# Patient Record
Sex: Female | Born: 1950 | Race: White | Hispanic: No | Marital: Married | Smoking: Never smoker
Health system: Southern US, Community
[De-identification: ages and names within clinical notes are randomized; demographics above are authoritative.]

## PROBLEM LIST (undated history)

## (undated) DIAGNOSIS — R748 Abnormal levels of other serum enzymes: Secondary | ICD-10-CM

## (undated) DIAGNOSIS — I10 Essential (primary) hypertension: Secondary | ICD-10-CM

## (undated) DIAGNOSIS — M1990 Unspecified osteoarthritis, unspecified site: Secondary | ICD-10-CM

## (undated) DIAGNOSIS — R7303 Prediabetes: Secondary | ICD-10-CM

## (undated) DIAGNOSIS — Z87442 Personal history of urinary calculi: Secondary | ICD-10-CM

## (undated) DIAGNOSIS — K219 Gastro-esophageal reflux disease without esophagitis: Secondary | ICD-10-CM

## (undated) DIAGNOSIS — E119 Type 2 diabetes mellitus without complications: Secondary | ICD-10-CM

## (undated) HISTORY — PX: EYE SURGERY: SHX253

## (undated) HISTORY — DX: Type 2 diabetes mellitus without complications: E11.9

## (undated) HISTORY — DX: Abnormal levels of other serum enzymes: R74.8

## (undated) HISTORY — PX: OTHER SURGICAL HISTORY: SHX169

## (undated) HISTORY — PX: TUBAL LIGATION: SHX77

---

## 2012-03-15 ENCOUNTER — Ambulatory Visit: Payer: 59

## 2012-04-19 ENCOUNTER — Emergency Department (HOSPITAL_COMMUNITY): Payer: 59

## 2012-04-19 ENCOUNTER — Encounter (HOSPITAL_COMMUNITY): Payer: Self-pay | Admitting: Emergency Medicine

## 2012-04-19 ENCOUNTER — Emergency Department (HOSPITAL_COMMUNITY)
Admission: EM | Admit: 2012-04-19 | Discharge: 2012-04-19 | Disposition: A | Discharge status: Home / Self Care | Payer: 59 | Attending: Emergency Medicine | Admitting: Emergency Medicine

## 2012-04-19 DIAGNOSIS — N200 Calculus of kidney: Secondary | ICD-10-CM

## 2012-04-19 LAB — URINALYSIS, ROUTINE W REFLEX MICROSCOPIC
Bilirubin Urine: NEGATIVE
Glucose, UA: NEGATIVE mg/dL
Specific Gravity, Urine: 1.014 (ref 1.005–1.030)
Urobilinogen, UA: 0.2 mg/dL (ref 0.0–1.0)
pH: 6 (ref 5.0–8.0)

## 2012-04-19 LAB — URINE MICROSCOPIC-ADD ON

## 2012-04-19 MED ORDER — HYDROCODONE-ACETAMINOPHEN 5-325 MG PO TABS: 2.0000 | ORAL_TABLET | ORAL | Status: DC | PRN

## 2012-04-19 MED ORDER — NAPROXEN 500 MG PO TABS: 500.0000 mg | ORAL_TABLET | Freq: Two times a day (BID) | ORAL | Status: DC

## 2012-04-19 MED ORDER — OXYCODONE-ACETAMINOPHEN 5-325 MG PO TABS
2.0000 | ORAL_TABLET | Freq: Four times a day (QID) | ORAL | Status: DC | PRN
  Administered 2012-04-19: 2 via ORAL
  Filled 2012-04-19: qty 2
  Filled 2012-04-19: qty 1

## 2012-04-19 NOTE — ED Provider Notes (Signed)
History     CSN: [id number]  Arrival date & time 04/19/12  1944   First MD Initiated Contact with Patient 04/19/12 2051      Chief Complaint  Patient presents with  . Flank Pain    (Consider location/radiation/quality/duration/timing/severity/associated sxs/prior treatment) HPI Comments: Flank pain is on the R, intermittent, acute in onset, colicky, radiates to the groin and the R leg, not associated with n/v/diaphoresis.  She has had no dysuria, but has been having some urinary frequency.  She has no f/c/n/v and has no hx of KS's.  She has had UTI's in the past.  Nothing makes better or worse, 10/10 at worst, 0/10 at this time.  Patient is a 62 y.o. female presenting with flank pain. The history is provided by the patient.  Flank Pain This is a new problem. Episode onset: had onset of flank pain a couple of weeks ago on the R and has bee nintermittent.    History reviewed. No pertinent past medical history.  History reviewed. No pertinent past surgical history.  Family History  Problem Relation Age of Onset  . Diabetes Mother   . CAD Mother     History  Substance Use Topics  . Smoking status: Never Smoker   . Smokeless tobacco: Not on file  . Alcohol Use: No    OB History   Grav Para Term Preterm Abortions TAB SAB Ect Mult Living                  Review of Systems  Genitourinary: Positive for flank pain.  All other systems reviewed and are negative.    Allergies  Penicillins; Shellfish allergy; and Sulfa antibiotics  Home Medications   Current Outpatient Rx  Name  Route  Sig  Dispense  Refill  . acetaminophen (TYLENOL) 500 MG tablet   Oral   Take 1,000 mg by mouth every 6 (six) hours as needed for pain.         . Multiple Vitamin (MULTIVITAMIN WITH MINERALS) TABS   Oral   Take 1 tablet by mouth daily.         Marland Kitchen HYDROcodone-acetaminophen (NORCO/VICODIN) 5-325 MG per tablet   Oral   Take 2 tablets by mouth every 4 (four) hours as needed for  pain.   10 tablet   0   . naproxen (NAPROSYN) 500 MG tablet   Oral   Take 1 tablet (500 mg total) by mouth 2 (two) times daily with a meal.   30 tablet   0     BP 156/78  Pulse 74  Temp(Src) 98 F (36.7 C) (Oral)  Resp 18  SpO2 98%  Physical Exam  Nursing note and vitals reviewed. Constitutional: She appears well-developed and well-nourished. No distress.  HENT:  Head: Normocephalic and atraumatic.  Mouth/Throat: Oropharynx is clear and moist. No oropharyngeal exudate.  Eyes: Conjunctivae and EOM are normal. Pupils are equal, round, and reactive to light. Right eye exhibits no discharge. Left eye exhibits no discharge. No scleral icterus.  Neck: Normal range of motion. Neck supple. No JVD present. No thyromegaly present.  Cardiovascular: Normal rate, regular rhythm, normal heart sounds and intact distal pulses.  Exam reveals no gallop and no friction rub.   No murmur heard. Pulmonary/Chest: Effort normal and breath sounds normal. No respiratory distress. She has no wheezes. She has no rales.  Abdominal: Soft. Bowel sounds are normal. She exhibits no distension and no mass. There is no tenderness.  No abd ttp, no CVA  ttp  Musculoskeletal: Normal range of motion. She exhibits no edema and no tenderness.  Lymphadenopathy:    She has no cervical adenopathy.  Neurological: She is alert. Coordination normal.  Skin: Skin is warm and dry. No rash noted. No erythema.  Psychiatric: She has a normal mood and affect. Her behavior is normal.    ED Course  Procedures (including critical care time)  Labs Reviewed  URINALYSIS, ROUTINE W REFLEX MICROSCOPIC - Abnormal; Notable for the following:    Hgb urine dipstick TRACE (*)    All other components within normal limits  URINE MICROSCOPIC-ADD ON   Ct Abdomen Pelvis Wo Contrast  04/19/2012  *RADIOLOGY REPORT*  Clinical Data: Right flank pain.  Dysuria.  CT ABDOMEN AND PELVIS WITHOUT CONTRAST  Technique:  Multidetector CT imaging of  the abdomen and pelvis was performed following the standard protocol without intravenous contrast.  Comparison: None.  Findings: Punctate calcifications in the spleen are compatible with remote granulomatous disease.  Liver at the upper limits of normal in size.  Adrenal glands and pancreatic contour within normal limits. Gallbladder contracted with a 9 mm gallstone visualized.  Small peripancreatic and portacaval lymph nodes noted.  1-2 mm mid kidney nonobstructive calculi noted bilaterally.  There is no right hydronephrosis, but there is a 2 mm calculus along the right UVJ junction which could be loose within the bladder or within the UVJ.  Appendix normal.  No dilated bowel.  Uterine and ovarian contours unremarkable.  No free pelvic fluid. No pathologic pelvic adenopathy is identified.  IMPRESSION:  1.  2 mm calculus in the vicinity of the right UVJ could be loose within the urinary bladder or in the UVJ.  No right-sided hydronephrosis or hydroureter. 2.  Single tiny punctate mid kidney nonobstructive calculi bilaterally. 3.  Gallstone observed. 4.  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 5.  Borderline prominent liver.   Original Report Authenticated By: Gaylyn Rong, M.D.      1. Kidney stone       MDM  At this time, pt is stable, has colicky pain without reproducible pain or evidence of hernias.  She has mild hypertension - at this time will CT abd without contrast to eval for KS.  CT reviewed, no signs of obstruction, 2 mm stone has already passed, pt still comfortable, meds and uro f/u given.      Vida Roller, MD 04/19/12 2214

## 2012-04-19 NOTE — ED Notes (Signed)
Pt states she is having pain in her right flank that goes around and is having lower abd cramping  Pt states she also notices an odor  Pt states she is having urinary frequency

## 2012-04-19 NOTE — ED Notes (Signed)
Patient c/o R sided flank pain, increased urine frequency and pain with urination. This has been going on intermittently for 2 weeks. Patient denies pain at this time.

## 2013-05-24 ENCOUNTER — Other Ambulatory Visit: Payer: Self-pay | Admitting: Family Medicine

## 2013-05-24 DIAGNOSIS — M40209 Unspecified kyphosis, site unspecified: Secondary | ICD-10-CM

## 2015-07-14 ENCOUNTER — Other Ambulatory Visit: Payer: Self-pay | Admitting: Family Medicine

## 2015-07-14 DIAGNOSIS — M810 Age-related osteoporosis without current pathological fracture: Secondary | ICD-10-CM

## 2015-07-28 ENCOUNTER — Ambulatory Visit
Admission: RE | Admit: 2015-07-28 | Discharge: 2015-07-28 | Disposition: A | Discharge status: Home / Self Care | Payer: 59 | Source: Ambulatory Visit | Attending: Family Medicine | Admitting: Family Medicine

## 2015-07-28 DIAGNOSIS — M810 Age-related osteoporosis without current pathological fracture: Secondary | ICD-10-CM

## 2017-05-19 ENCOUNTER — Encounter: Payer: Self-pay | Admitting: Gastroenterology

## 2017-05-21 NOTE — Progress Notes (Signed)
Patient ID: Jillian Powell, female   DOB: 1950/03/24, 67 y.o.   MRN: [id number]          Chief complaint: High calcium  History of Present Illness:  Referring PCP: Scherrie Gerlach PA   Review of records show that she has had a high calcium since 11/16 Except for once her calcium has been below 11, highest calcium 11.4 Most recent calcium level is 10.7 done in February 2019  No results found for: CALCIUM  The hypercalcemia is not associated with any pathologic fractures, renal insufficiency, sarcoidosis, known carcinoma, hyperthyroidism.  She did have kidney stones in 2015  She thinks she has lost 1.5 inches since she was young  Menopause occurred at age 33  Told to have osteoporosis and her T score was -2.5 but date of the study is not known  Prior serologic and radiologic studies have included:  No results found for: PTH, CALCIUM, CAION, PHOS   PTH levels: In 09/2015 = 46 and in 11/17 = 24  01/26/2016: 24 urine calcium =526.4 (H)  25 (OH) Vitamin D level, last was 29.5 and 1, 25 hydroxy vitamin D was 43.7    Parathyroid scan in done in 03/2016: Mild activity corresponding to 5 x 5 x 3 mm nodular density posterior to the upper pole of the right thyroid lobe. These findings suggest the possibility of small parathyroid adenoma at this site.    No results found for: VD25OH  Prior treatment has included boniva for 3 years, not clear of the starting and stopping dates, probably stopped in 2017 She does not take any calcium or vitamin D supplements   Allergies as of 05/22/2017      Reactions   Penicillins Hives, Itching   Shellfish Allergy    Sulfa Antibiotics Hives, Itching      Medication List        Accurate as of 05/22/17  9:37 PM. Always use your most recent med list.          LOTREL 5-10 MG capsule Generic drug:  amLODipine-benazepril TK 1 C PO D   multivitamin with minerals Tabs tablet Take 1 tablet by mouth daily.       Allergies:  Allergies  Allergen  Reactions  . Penicillins Hives and Itching  . Shellfish Allergy   . Sulfa Antibiotics Hives and Itching    History reviewed. No pertinent past medical history.  History reviewed. No pertinent surgical history.  Family History  Problem Relation Age of Onset  . Diabetes Mother   . CAD Mother   . Thyroid disease Neg Hx     Social History:  reports that  has never smoked. She does not have any smokeless tobacco history on file. She reports that she does not drink alcohol or use drugs.  Review of Systems  Constitutional: Positive for weight loss.       Previous range 164-180, has been trying to lose weight  Eyes: Negative for visual disturbance.  Respiratory: Negative for shortness of breath.   Cardiovascular: Negative for palpitations and leg swelling.  Gastrointestinal: Negative for vomiting and constipation.  Endocrine: Negative for fatigue.       She had a fasting glucose of 162 in February 19, no recent A1c available.  Thyroid level has been normal  Genitourinary: Positive for nocturia.       1-2 times  Musculoskeletal: Negative for joint pain.  Skin: Negative for rash.  Neurological: Negative for numbness and tingling.     EXAM:  BP  128/80 (BP Location: Left Arm, Patient Position: Sitting, Cuff Size: Normal)   Pulse 68   Ht 5\' 4"  (1.626 m)   Wt 164 lb (74.4 kg)   SpO2 98%   BMI 28.15 kg/m   GENERAL: Averagely built and nourished  No pallor, clubbing, lymphadenopathy or edema.  Skin:  no rash or pigmentation.  EYES:  Externally normal.  Fundii:  normal discs and vessels.  ENT: Oral mucosa and tongue normal.  THYROID:  Not palpable.   HEART:  Normal  S1 and S2; no murmur or click.  CHEST:  Normal shape Lungs:   Vescicular breath sounds heard equally.  No crepitations/ wheeze.  ABDOMEN:  No distention.  Liver and spleen not palpable.  No other mass or tenderness.  NEUROLOGICAL: .Reflexes are normal bilaterally at biceps.  SPINE AND JOINTS:   Normal.  Assessment/Plan:   HYPERPARATHYROIDISM:  Currently the patient has mild persistent hypercalcemia since at least 2016 which is stable This was confirmed with inappropriately high PTH at baseline and finding of a parathyroid adenoma on nuclear scanning previously  Discussed the nature of primary hyperparathyroidism as well as normal role of the parathyroid glands and calcium and bone physiology.  Discussed potential  effects of hyperparathyroidism long-term on bone health, kidney stones and kidney function Explained to patient that surgery is indicated only there are symptoms of high calcium, calcium level over 1 point above the normal range or significant osteoporosis  Baseline bone density showed T score of -2.5 prior to bisphosphonate treatment which has been stopped a few years ago The status of her osteoporosis is unknown and not clear when she had her last bone density  She also has had significant hypercalciuria but no kidney stones since 2015  RECOMMENDATIONS:  Follow-up bone density to assess effects of hyperparathyroidism on bone strength and this will determine further management  Since she is refusing to consider parathyroid surgery will discuss options to control her osteoporosis  Given her options of various treatments for osteoporosis and she may be a good candidate for Prolia since she already has tried bisphosphonates and needs likely a long-term continued treatment that would be safe  Check 24-hour urine calcium  Check vitamin D level and also 1, 25 hydroxy vitamin D level to determine need for supplementation  Currently patient is reluctant to take any medications since she thinks she can heal with Faith and will discuss her treatment plan again once labs are available  DIABETES ?  Her fasting blood sugar was 162 and she may have diabetes but need to have an A1c to confirm this today  History of fatty liver: Although she has lost weight she continues to  have high liver functions may consider Actoplusmet if she has diabetes also   Elayne Snare 05/22/2017, 9:37 PM

## 2017-05-22 ENCOUNTER — Encounter: Payer: Self-pay | Admitting: Endocrinology

## 2017-05-22 ENCOUNTER — Ambulatory Visit: Payer: 59 | Admitting: Endocrinology

## 2017-05-22 VITALS — BP 128/80 | HR 68 | Ht 64.0 in | Wt 164.0 lb

## 2017-05-22 DIAGNOSIS — M810 Age-related osteoporosis without current pathological fracture: Secondary | ICD-10-CM

## 2017-05-22 DIAGNOSIS — E213 Hyperparathyroidism, unspecified: Secondary | ICD-10-CM | POA: Diagnosis not present

## 2017-05-22 DIAGNOSIS — R7301 Impaired fasting glucose: Secondary | ICD-10-CM | POA: Diagnosis not present

## 2017-05-22 DIAGNOSIS — E559 Vitamin D deficiency, unspecified: Secondary | ICD-10-CM

## 2017-05-23 ENCOUNTER — Encounter: Payer: Self-pay | Admitting: Endocrinology

## 2017-05-23 LAB — GLUCOSE, RANDOM: Glucose, Bld: 106 mg/dL — ABNORMAL HIGH (ref 70–99)

## 2017-05-23 LAB — VITAMIN D 25 HYDROXY (VIT D DEFICIENCY, FRACTURES): VITD: 24.67 ng/mL — AB (ref 30.00–100.00)

## 2017-05-23 LAB — HEMOGLOBIN A1C: HEMOGLOBIN A1C: 7 % — AB (ref 4.6–6.5)

## 2017-05-24 ENCOUNTER — Telehealth: Payer: Self-pay | Admitting: Endocrinology

## 2017-05-24 NOTE — Telephone Encounter (Signed)
Patient stated she had some test done, she is calling for the results.

## 2017-05-25 NOTE — Telephone Encounter (Signed)
She has diabetes with an A1c of 7%.  Recommend starting metformin ER since she already is doing well with diet and exercise.  She will take 1 tablet daily for the first 5 days and then 2 tablets daily until her follow-up. Her vitamin D level is low and she can take vitamin D3, 1000 units daily She needs to return in her 24-hour urine test also

## 2017-05-25 NOTE — Telephone Encounter (Signed)
Patient calling for results but they have not been released yet please advise

## 2017-05-29 LAB — VITAMIN D 1,25 DIHYDROXY
VITAMIN D 1, 25 (OH) TOTAL: 72 pg/mL — AB
Vitamin D2 1, 25 (OH)2: <10 pg/mL
Vitamin D3 1, 25 (OH)2: 71 pg/mL

## 2017-05-31 ENCOUNTER — Other Ambulatory Visit: Payer: Medicare HMO

## 2017-06-01 ENCOUNTER — Other Ambulatory Visit: Payer: Self-pay | Admitting: Endocrinology

## 2017-06-01 DIAGNOSIS — E1165 Type 2 diabetes mellitus with hyperglycemia: Secondary | ICD-10-CM

## 2017-06-03 LAB — CREATININE, URINE, 24 HOUR
Creatinine, 24H Ur: 383 mg/24 hr — ABNORMAL LOW (ref 800–1800)
Creatinine, Urine: 69.7 mg/dL

## 2017-06-03 LAB — CALCIUM, URINE, 24 HOUR
Calcium, 24H Urine: 188.7 mg/24 hr (ref 100.0–300.0)
Calcium, Urine: 34.3 mg/dL

## 2017-06-06 NOTE — Telephone Encounter (Signed)
Patient has been informed of the note below but is refusing metformin at this time

## 2017-06-23 ENCOUNTER — Encounter: Payer: Medicare HMO | Attending: Endocrinology | Admitting: Dietician

## 2017-06-23 ENCOUNTER — Encounter: Payer: Self-pay | Admitting: Dietician

## 2017-06-23 DIAGNOSIS — Z6826 Body mass index (BMI) 26.0-26.9, adult: Secondary | ICD-10-CM | POA: Diagnosis not present

## 2017-06-23 DIAGNOSIS — Z713 Dietary counseling and surveillance: Secondary | ICD-10-CM | POA: Diagnosis not present

## 2017-06-23 DIAGNOSIS — E1165 Type 2 diabetes mellitus with hyperglycemia: Secondary | ICD-10-CM | POA: Insufficient documentation

## 2017-06-23 DIAGNOSIS — E119 Type 2 diabetes mellitus without complications: Secondary | ICD-10-CM

## 2017-06-23 NOTE — Progress Notes (Signed)
Diabetes Self-Management Education  Visit Type: First/Initial  Appt. Start Time: 1040 Appt. End Time: 1150  06/23/2017  [name], identified by name and date of birth, is a 67 y.o. female with a diagnosis of Diabetes: Type 2. Other history includes hyperparathyroidism.  Diabetes is newly diagnosed. Labs include low vitamin D2. Weight has decreased 7 lbs in the past month from 164 lbs to 157 lbs.  Patient lives with her husband who has diabetes and CKD.  She has been re-reading his educational material, measuring foods, staying active and being more mindful of food choices.  She is a retired Chief Strategy Officer.  ASSESSMENT  Height 5\' 4"  (1.626 m), weight 157 lb (71.2 kg). Body mass index is 26.95 kg/m.  Diabetes Self-Management Education - 06/23/17 1049      Visit Information   Visit Type  First/Initial      Initial Visit   Diabetes Type  Type 2    Are you currently following a meal plan?  No    Are you taking your medications as prescribed?  Not on Medications    Date Diagnosed  05/2016      Health Coping   How would you rate your overall health?  Excellent      Psychosocial Assessment   Patient Belief/Attitude about Diabetes  Motivated to manage diabetes    Self-care barriers  None    Self-management support  Doctor's office;Family    Other persons present  Patient    Patient Concerns  Nutrition/Meal planning;Glycemic Control;Weight Control    Special Needs  None    Preferred Learning Style  No preference indicated    Learning Readiness  Ready    How often do you need to have someone help you when you read instructions, pamphlets, or other written materials from your doctor or pharmacy?  1 - Never      Pre-Education Assessment   Patient understands the diabetes disease and treatment process.  Needs Instruction    Patient understands incorporating nutritional management into lifestyle.  Needs Instruction    Patient undertands incorporating physical activity into  lifestyle.  Needs Instruction    Patient understands using medications safely.  Needs Instruction    Patient understands monitoring blood glucose, interpreting and using results  Needs Instruction    Patient understands prevention, detection, and treatment of acute complications.  Needs Instruction    Patient understands prevention, detection, and treatment of chronic complications.  Needs Instruction    Patient understands how to develop strategies to address psychosocial issues.  Needs Instruction    Patient understands how to develop strategies to promote health/change behavior.  Needs Instruction      Complications   Last HgB A1C per patient/outside source  7 % 05/2016    How often do you check your blood sugar?  0 times/day (not testing)    Fasting Blood glucose range (mg/dL)  70-129    Have you had a dilated eye exam in the past 12 months?  Yes    Have you had a dental exam in the past 12 months?  Yes    Are you checking your feet?  No      Dietary Intake   Breakfast  coffee, 1% milk, [employer] toast with butter, occasional fruit OR egg and 1 strip bacon on the weekend, 1 slice toast [facility]) with butter    Snack (morning)  none    Lunch  salad, cranberries, nuts, tuna or 1 ounce deli meat    Snack (afternoon)  none    Dinner  salad, protein, potatoe or rice    Snack (evening)  none    Beverage(s)  cofee, 1% milk, water, rare splenda lemonade      Exercise   Exercise Type  Light (walking / raking leaves)    How many days per week to you exercise?  7    How many minutes per day do you exercise?  40    Total minutes per week of exercise  280      Patient Education   Previous Diabetes Education  No    Disease state   Definition of diabetes, type 1 and 2, and the diagnosis of diabetes    Nutrition management   Food label reading, portion sizes and measuring food.;Role of diet in the treatment of diabetes and the relationship between the three main macronutrients and blood glucose level;Meal  options for control of blood glucose level and chronic complications.    Medications  Taught/reviewed insulin injection, site rotation, insulin storage and needle disposal.    Chronic complications  Assessed and discussed foot care and prevention of foot problems;Relationship between chronic complications and blood glucose control    Psychosocial adjustment  Role of stress on diabetes;Worked with patient to identify barriers to care and solutions      Individualized Goals (developed by patient)   Nutrition  General guidelines for healthy choices and portions discussed    Physical Activity  Exercise 5-7 days per week;45 minutes per day    Monitoring   Not Applicable    Reducing Risk  increase portions of healthy fats    Health Coping  discuss diabetes with (comment) MD, RD, CDE      Post-Education Assessment   Patient understands the diabetes disease and treatment process.  Demonstrates understanding / competency    Patient understands incorporating nutritional management into lifestyle.  Demonstrates understanding / competency    Patient undertands incorporating physical activity into lifestyle.  Demonstrates understanding / competency    Patient understands using medications safely.  Demonstrates understanding / competency    Patient understands monitoring blood glucose, interpreting and using results  Demonstrates understanding / competency    Patient understands prevention, detection, and treatment of acute complications.  Demonstrates understanding / competency    Patient understands prevention, detection, and treatment of chronic complications.  Demonstrates understanding / competency    Patient understands how to develop strategies to address psychosocial issues.  Demonstrates understanding / competency    Patient understands how to develop strategies to promote health/change behavior.  Demonstrates understanding / competency      Outcomes   Expected Outcomes  Demonstrated interest in  learning. Expect positive outcomes    Future DMSE  PRN    Program Status  Completed       Individualized Plan for Diabetes Self-Management Training:   Learning Objective:  Patient will have a greater understanding of diabetes self-management. Patient education plan is to attend individual and/or group sessions per assessed needs and concerns.   Plan:   Patient Instructions  Continue to eat mindfully. Continue to stay active. Great job on changing your beverages to those without sugar!  Aim for 2-3 Carb Choices per meal (30-45 grams) +/- 1 either way  Aim for 0-1 Carbs per snack if hungry  Include protein in moderation with your meals and snacks Continue reading food labels for Total Carbohydrate and Fat Grams of foods   Other Resources: Diabetes Food Hub Diabetes Forcast Diabetes Self-Management* [employer] Dr. Alyssa Grove (plant  based eating)     Expected Outcomes:  Demonstrated interest in learning. Expect positive outcomes  Education material provided: Food label handouts, A1C conversion sheet, Meal plan card, My Plate and Snack sheet, ADA Diabetes Your Take Control Guide  If problems or questions, patient to contact team via:  Phone  Future DSME appointment: PRN

## 2017-06-23 NOTE — Patient Instructions (Signed)
Continue to eat mindfully. Continue to stay active. Great job on changing your beverages to those without sugar!  Aim for 2-3 Carb Choices per meal (30-45 grams) +/- 1 either way  Aim for 0-1 Carbs per snack if hungry  Include protein in moderation with your meals and snacks Continue reading food labels for Total Carbohydrate and Fat Grams of foods   Other Resources: Diabetes Food Hub Diabetes Forcast Diabetes Self-Management* [employer] Dr. Alyssa Grove (plant based eating)

## 2017-06-27 ENCOUNTER — Telehealth: Payer: Self-pay

## 2017-06-27 NOTE — Telephone Encounter (Signed)
Pt called to check on status of bone density appointment. Pt stated that she has an upcoming appointment on 07/28/2017. Pt was informed of MD instruction to call and schedule an appointment with Dr. Dwyane Dee once the bone density scan is completed. Pt verbalized understanding.

## 2017-07-03 ENCOUNTER — Encounter: Payer: Self-pay | Admitting: Gastroenterology

## 2017-07-03 ENCOUNTER — Other Ambulatory Visit (INDEPENDENT_AMBULATORY_CARE_PROVIDER_SITE_OTHER): Payer: Medicare HMO

## 2017-07-03 ENCOUNTER — Ambulatory Visit: Payer: Medicare HMO | Admitting: Gastroenterology

## 2017-07-03 VITALS — BP 108/72 | HR 70 | Ht 64.0 in | Wt 157.4 lb

## 2017-07-03 DIAGNOSIS — R945 Abnormal results of liver function studies: Secondary | ICD-10-CM

## 2017-07-03 DIAGNOSIS — R7989 Other specified abnormal findings of blood chemistry: Secondary | ICD-10-CM

## 2017-07-03 LAB — CBC WITH DIFFERENTIAL/PLATELET
Basophils Absolute: 0 10*3/uL (ref 0.0–0.1)
Basophils Relative: 1.1 % (ref 0.0–3.0)
Eosinophils Absolute: 0.1 10*3/uL (ref 0.0–0.7)
Eosinophils Relative: 2.8 % (ref 0.0–5.0)
HCT: 40.9 % (ref 36.0–46.0)
Hemoglobin: 14 g/dL (ref 12.0–15.0)
LYMPHS ABS: 1.4 10*3/uL (ref 0.7–4.0)
Lymphocytes Relative: 31.8 % (ref 12.0–46.0)
MCHC: 34.3 g/dL (ref 30.0–36.0)
MCV: 88.5 fl (ref 78.0–100.0)
MONO ABS: 0.4 10*3/uL (ref 0.1–1.0)
Monocytes Relative: 8.6 % (ref 3.0–12.0)
NEUTROS ABS: 2.5 10*3/uL (ref 1.4–7.7)
NEUTROS PCT: 55.7 % (ref 43.0–77.0)
PLATELETS: 244 10*3/uL (ref 150.0–400.0)
RBC: 4.62 Mil/uL (ref 3.87–5.11)
RDW: 13.3 % (ref 11.5–15.5)
WBC: 4.5 10*3/uL (ref 4.0–10.5)

## 2017-07-03 LAB — COMPREHENSIVE METABOLIC PANEL
ALK PHOS: 98 U/L (ref 39–117)
ALT: 52 U/L — AB (ref 0–35)
AST: 47 U/L — ABNORMAL HIGH (ref 0–37)
Albumin: 4.3 g/dL (ref 3.5–5.2)
BUN: 13 mg/dL (ref 6–23)
CO2: 25 mEq/L (ref 19–32)
Calcium: 10.9 mg/dL — ABNORMAL HIGH (ref 8.4–10.5)
Chloride: 106 mEq/L (ref 96–112)
Creatinine, Ser: 0.63 mg/dL (ref 0.40–1.20)
GFR: 100.16 mL/min (ref >60.00)
GLUCOSE: 91 mg/dL (ref 70–99)
POTASSIUM: 4.1 meq/L (ref 3.5–5.1)
SODIUM: 139 meq/L (ref 135–145)
TOTAL PROTEIN: 7.9 g/dL (ref 6.0–8.3)
Total Bilirubin: 0.5 mg/dL (ref 0.2–1.2)

## 2017-07-03 LAB — IBC PANEL
Iron: 115 ug/dL (ref 42–145)
Saturation Ratios: 36 % (ref 20.0–50.0)
TRANSFERRIN: 228 mg/dL (ref 212.0–360.0)

## 2017-07-03 LAB — FERRITIN: Ferritin: 736 ng/mL — ABNORMAL HIGH (ref 10.0–291.0)

## 2017-07-03 LAB — IGA: IGA: 324 mg/dL (ref 68–378)

## 2017-07-03 LAB — PROTIME-INR
INR: 1.1 ratio — AB (ref 0.8–1.0)
PROTHROMBIN TIME: 12.7 s (ref 9.6–13.1)

## 2017-07-03 NOTE — Patient Instructions (Addendum)
You will get labs drawn today:  Hepatitis A (IgM and IgG), Hepatitis B surface antigen, Hepatitis B surface antibody, Hepatitis C antibody, total iron, ferritin, TIBC, ANA, AMA, alphafeto protein (AFP), anti smooth muscle antibody, alpha 1 antitrypsin, cerulloplasm, TTG, total IgA level, CBC, CMET, INR. [place] for elevated liver tests. Further recommendations pending the above tests.  You have been scheduled for an abdominal ultrasound at [facility] (1st floor of hospital) on 07/05/17 at 10:30am. Please arrive 15 minutes prior to your appointment for registration. Make certain not to have anything to eat or drink 6 hours prior to your appointment. Should you need to reschedule your appointment, please contact radiology at [phone]. This test typically takes about 30 minutes to perform.   Normal BMI (Body Mass Index- based on height and weight) is between 23 and 30. Your BMI today is Body mass index is 27.01 kg/m. Marland Kitchen Please consider follow up  regarding your BMI with your Primary Care Provider.

## 2017-07-03 NOTE — Progress Notes (Signed)
HPI: This is a very pleasant 67 year old woman who was referred to me by Rich Fuchs, PA  to evaluate elevated liver tests.    Chief complaint is elevated liver tests  8 pounds loss in 1 month.  Very rarely etoh drinker.  No liver disease in her family.  Says she had hep C testing previously and it was negative.  Vaccinated for hepatitis B (did 2/3 of the imunizations).  She has never been told that she had hepatitis, she has never turned jaundiced, she was never a big alcohol drinker.  I reviewed with her that her liver tests have been chronically elevated and this is new information to her.  Old Data Reviewed:  Lab results available in "care everywhere" show that her liver tests have been elevated for at least 5 years.  Mild transaminase elevation February 2014.  Same mild transaminase elevation with elevated alkaline phosphatase, mild, in early 2015.  Liver tests have been checked 6 or 8 times since then, transaminases and alkaline phosphatase all intermittently elevated since then.   Review of systems: Pertinent positive and negative review of systems were noted in the above HPI section. All other review negative.   Past Medical History:  Diagnosis Date  . Diabetes mellitus without complication ([place])     History reviewed. No pertinent surgical history.  Current Outpatient Medications  Medication Sig Dispense Refill  . Biotin 1 MG CAPS Take by mouth.    Marland Kitchen LOTREL 5-10 MG capsule TK 1 C PO D  1  . Multiple Vitamin (MULTIVITAMIN WITH MINERALS) TABS Take 1 tablet by mouth daily.     No current facility-administered medications for this visit.     Allergies as of 07/03/2017 - Review Complete 07/03/2017  Allergen Reaction Noted  . Penicillins Hives and Itching 04/19/2012  . Shellfish allergy  04/19/2012  . Sulfa antibiotics Hives and Itching 04/19/2012    Family History  Problem Relation Age of Onset  . Diabetes Mother   . CAD Mother   . Thyroid disease Neg  Hx     Social History   Socioeconomic History  . Marital status: Married    Spouse name: Not on file  . Number of children: Not on file  . Years of education: Not on file  . Highest education level: Not on file  Occupational History  . Not on file  Social Needs  . Financial resource strain: Not on file  . Food insecurity:    Worry: Not on file    Inability: Not on file  . Transportation needs:    Medical: Not on file    Non-medical: Not on file  Tobacco Use  . Smoking status: Never Smoker  . Smokeless tobacco: Never Used  Substance and Sexual Activity  . Alcohol use: No  . Drug use: No  . Sexual activity: Not on file  Lifestyle  . Physical activity:    Days per week: Not on file    Minutes per session: Not on file  . Stress: Not on file  Relationships  . Social connections:    Talks on phone: Not on file    Gets together: Not on file    Attends religious service: Not on file    Active member of club or organization: Not on file    Attends meetings of clubs or organizations: Not on file    Relationship status: Not on file  . Intimate partner violence:    Fear of current or ex partner:  Not on file    Emotionally abused: Not on file    Physically abused: Not on file    Forced sexual activity: Not on file  Other Topics Concern  . Not on file  Social History Narrative  . Not on file     Physical Exam: BP 108/72   Pulse 70   Ht 5\' 4"  (1.626 m)   Wt 157 lb 6 oz (71.4 kg)   BMI 27.01 kg/m  Constitutional: generally well-appearing Psychiatric: alert and oriented x3 Eyes: extraocular movements intact Mouth: oral pharynx moist, no lesions Neck: supple no lymphadenopathy Cardiovascular: heart regular rate and rhythm Lungs: clear to auscultation bilaterally Abdomen: soft, nontender, nondistended, no obvious ascites, no peritoneal signs, normal bowel sounds Extremities: no lower extremity edema bilaterally Skin: no lesions on visible extremities   Assessment  and plan: 67 y.o. female with elevated liver tests, no clinical sign of cirrhosis  Possibly she has fatty liver disease.  She has no clinical signs of cirrhosis or some type symptoms based on her history of cirrhosis.  She will get an [place] and a battery of blood tests to work-up the etiology of her elevated liver test, see those test below.  Further recommendations pending those results.   Please see the "Patient Instructions" section for addition details about the plan.   Owens Loffler, MD [facility] 07/03/2017, 10:58 AM  Cc: Rich Fuchs, PA

## 2017-07-04 LAB — TISSUE TRANSGLUTAMINASE, IGA: (tTG) Ab, IgA: 1 U/mL

## 2017-07-04 LAB — HEPATITIS C ANTIBODY
HEP C AB: NONREACTIVE
SIGNAL TO CUT-OFF: 0.02 (ref <1.00)

## 2017-07-04 LAB — HEPATITIS B SURFACE ANTIGEN: HEP B S AG: NONREACTIVE

## 2017-07-04 LAB — HEPATITIS A ANTIBODY, TOTAL: HEPATITIS A AB,TOTAL: REACTIVE — AB

## 2017-07-04 LAB — HEPATITIS B SURFACE ANTIBODY, QUANTITATIVE: Hepatitis B-Post: 419 m[IU]/mL (ref >=10)

## 2017-07-04 LAB — ALPHA-1-ANTITRYPSIN: A-1 Antitrypsin, Ser: 135 mg/dL (ref 83–199)

## 2017-07-04 LAB — CERULOPLASMIN: Ceruloplasmin: 34 mg/dL (ref 18–53)

## 2017-07-04 LAB — AFP TUMOR MARKER: AFP-Tumor Marker: 2.2 ng/mL

## 2017-07-05 ENCOUNTER — Ambulatory Visit (HOSPITAL_COMMUNITY)
Admission: RE | Admit: 2017-07-05 | Discharge: 2017-07-05 | Disposition: A | Discharge status: Home / Self Care | Payer: Medicare HMO | Source: Ambulatory Visit | Attending: Gastroenterology | Admitting: Gastroenterology

## 2017-07-05 DIAGNOSIS — R945 Abnormal results of liver function studies: Principal | ICD-10-CM

## 2017-07-05 DIAGNOSIS — R7989 Other specified abnormal findings of blood chemistry: Secondary | ICD-10-CM

## 2017-07-05 LAB — ANA: Anti Nuclear Antibody(ANA): POSITIVE — AB

## 2017-07-05 LAB — ANTI-NUCLEAR AB-TITER (ANA TITER)

## 2017-07-05 LAB — MITOCHONDRIAL ANTIBODIES

## 2017-07-06 LAB — ANTI-SMOOTH MUSCLE ANTIBODY, IGG: Actin (Smooth Muscle) Antibody (IGG): <20 U (ref <20)

## 2017-07-10 ENCOUNTER — Telehealth: Payer: Self-pay | Admitting: Gastroenterology

## 2017-07-10 NOTE — Telephone Encounter (Signed)
Voicemail message left on patients phone with the phone number to contact to reschedule her [place]. I also mentioned she can call me back with dates available and I will call to reschedule.

## 2017-07-10 NOTE — Telephone Encounter (Signed)
Patient wants to reschedule [place] that was canceled on 5.1.19.

## 2017-07-12 ENCOUNTER — Other Ambulatory Visit: Payer: Self-pay | Admitting: Physician Assistant

## 2017-07-12 DIAGNOSIS — Z1231 Encounter for screening mammogram for malignant neoplasm of breast: Secondary | ICD-10-CM

## 2017-07-14 ENCOUNTER — Ambulatory Visit (HOSPITAL_COMMUNITY)
Admission: RE | Admit: 2017-07-14 | Discharge: 2017-07-14 | Disposition: A | Discharge status: Home / Self Care | Payer: Medicare HMO | Source: Ambulatory Visit | Attending: Gastroenterology | Admitting: Gastroenterology

## 2017-07-14 DIAGNOSIS — R7989 Other specified abnormal findings of blood chemistry: Secondary | ICD-10-CM | POA: Diagnosis present

## 2017-07-14 DIAGNOSIS — K8020 Calculus of gallbladder without cholecystitis without obstruction: Secondary | ICD-10-CM | POA: Diagnosis not present

## 2017-07-14 DIAGNOSIS — K760 Fatty (change of) liver, not elsewhere classified: Secondary | ICD-10-CM | POA: Insufficient documentation

## 2017-07-17 ENCOUNTER — Other Ambulatory Visit: Payer: Self-pay

## 2017-07-17 DIAGNOSIS — R945 Abnormal results of liver function studies: Principal | ICD-10-CM

## 2017-07-17 DIAGNOSIS — R7989 Other specified abnormal findings of blood chemistry: Secondary | ICD-10-CM

## 2017-07-26 ENCOUNTER — Other Ambulatory Visit: Payer: Medicare HMO

## 2017-07-26 DIAGNOSIS — R7989 Other specified abnormal findings of blood chemistry: Secondary | ICD-10-CM

## 2017-07-26 DIAGNOSIS — R945 Abnormal results of liver function studies: Principal | ICD-10-CM

## 2017-07-28 ENCOUNTER — Other Ambulatory Visit: Payer: Medicare HMO

## 2017-07-28 LAB — PROTEIN ELECTROPHORESIS, SERUM
Albumin ELP: 4.3 g/dL (ref 3.8–4.8)
Alpha 1: 0.3 g/dL (ref 0.2–0.3)
Alpha 2: 0.7 g/dL (ref 0.5–0.9)
BETA GLOBULIN: 0.5 g/dL (ref 0.4–0.6)
Beta 2: 0.5 g/dL (ref 0.2–0.5)
Gamma Globulin: 1.5 g/dL (ref 0.8–1.7)
Total Protein: 7.8 g/dL (ref 6.1–8.1)

## 2017-08-03 ENCOUNTER — Other Ambulatory Visit: Payer: Self-pay

## 2017-08-03 DIAGNOSIS — R7989 Other specified abnormal findings of blood chemistry: Secondary | ICD-10-CM

## 2017-08-03 DIAGNOSIS — R945 Abnormal results of liver function studies: Principal | ICD-10-CM

## 2017-08-16 ENCOUNTER — Ambulatory Visit (INDEPENDENT_AMBULATORY_CARE_PROVIDER_SITE_OTHER)
Admission: RE | Admit: 2017-08-16 | Discharge: 2017-08-16 | Disposition: A | Discharge status: Home / Self Care | Payer: Medicare HMO | Source: Ambulatory Visit | Attending: Endocrinology | Admitting: Endocrinology

## 2017-08-16 ENCOUNTER — Ambulatory Visit
Admission: RE | Admit: 2017-08-16 | Discharge: 2017-08-16 | Disposition: A | Discharge status: Home / Self Care | Payer: Medicare HMO | Source: Ambulatory Visit | Attending: Physician Assistant | Admitting: Physician Assistant

## 2017-08-16 ENCOUNTER — Other Ambulatory Visit: Payer: Medicare HMO

## 2017-08-16 DIAGNOSIS — Z1231 Encounter for screening mammogram for malignant neoplasm of breast: Secondary | ICD-10-CM

## 2017-08-16 DIAGNOSIS — M810 Age-related osteoporosis without current pathological fracture: Secondary | ICD-10-CM | POA: Diagnosis not present

## 2017-08-17 ENCOUNTER — Other Ambulatory Visit: Payer: Self-pay | Admitting: Physician Assistant

## 2017-08-17 DIAGNOSIS — R928 Other abnormal and inconclusive findings on diagnostic imaging of breast: Secondary | ICD-10-CM

## 2017-09-22 ENCOUNTER — Ambulatory Visit
Admission: RE | Admit: 2017-09-22 | Discharge: 2017-09-22 | Disposition: A | Discharge status: Home / Self Care | Payer: Medicare HMO | Source: Ambulatory Visit | Attending: Physician Assistant | Admitting: Physician Assistant

## 2017-09-22 ENCOUNTER — Ambulatory Visit: Payer: Medicare HMO

## 2017-09-22 DIAGNOSIS — R928 Other abnormal and inconclusive findings on diagnostic imaging of breast: Secondary | ICD-10-CM

## 2017-09-26 ENCOUNTER — Ambulatory Visit: Payer: Medicare HMO | Admitting: Endocrinology

## 2017-09-26 ENCOUNTER — Encounter: Payer: Self-pay | Admitting: Endocrinology

## 2017-09-26 VITALS — BP 116/66 | HR 86 | Ht 64.0 in | Wt 157.6 lb

## 2017-09-26 DIAGNOSIS — E213 Hyperparathyroidism, unspecified: Secondary | ICD-10-CM | POA: Insufficient documentation

## 2017-09-26 DIAGNOSIS — E1165 Type 2 diabetes mellitus with hyperglycemia: Secondary | ICD-10-CM

## 2017-09-26 LAB — POCT GLYCOSYLATED HEMOGLOBIN (HGB A1C): HEMOGLOBIN A1C: 5.8 % — AB (ref 4.0–5.6)

## 2017-09-26 LAB — GLUCOSE, POCT (MANUAL RESULT ENTRY): POC Glucose: 167 mg/dl — AB (ref 70–99)

## 2017-09-26 MED ORDER — RISEDRONATE SODIUM 150 MG PO TABS: 150.0000 mg | ORAL_TABLET | ORAL | 1 refills | Status: DC

## 2017-09-26 NOTE — Progress Notes (Signed)
Patient ID: Jillian Powell, female   DOB: [birth year], 67 y.o.   MRN: [id number]          Chief complaint: Endocrinology follow-up  History of Present Illness:   HYPERCALCEMIA:  Review of records show that she has had a high calcium since 11/16 Previously highest calcium 11.4 Most recent calcium level is 10.9, done in April 2019  Lab Results  Component Value Date   CALCIUM 10.9 (H) 07/03/2017   .  She did have kidney stones in 2015 Previously had high 24 urine calcium She had 24 urine calcium checked which was normal but her collection was inadequate with low amount of creatinine in the measurement  Prior serologic and radiologic studies have included:   PTH levels: In 09/2015 = 46 and in 11/17 = 24  01/26/2016: 24 urine calcium =526.4 (H)  25 (OH) Vitamin D level, last was 29.5 and 1, 25 hydroxy vitamin D was 43.7   BONE density done on 08/16/2017 showed:   Lumbar spine L1-L4 Femoral neck (FN) 33% distal radius  T-score -2.6 RFN: -2.3 LFN: -2.4 n/a  Change in BMD from previous DXA test (%) n/a n/a n/a   Previous bone density in 2017 showed spine T score -2.5   Parathyroid scan in done in 03/2016: Mild activity corresponding to 5 x 5 x 3 mm nodular density posterior to the upper pole of the right thyroid lobe. These findings suggest the possibility of small parathyroid adenoma at this site.    Lab Results  Component Value Date   VD25OH 24.67 (L) 05/22/2017    Prior treatment has included boniva for 3 years, not clear of the starting and stopping dates, probably stopped in 2017 She does not take any calcium or vitamin D supplements, does take multivitamin   Allergies as of 09/26/2017      Reactions   Penicillins Hives, Itching   Shellfish Allergy    Sulfa Antibiotics Hives, Itching      Medication List        Accurate as of 09/26/17 11:59 PM. Always use your most recent med list.          Biotin 1 MG Caps Take by mouth.   LOTREL 5-10 MG capsule Generic  drug:  amLODipine-benazepril TK 1 C PO D   multivitamin with minerals Tabs tablet Take 1 tablet by mouth daily.   risedronate 150 MG tablet Commonly known as:  ACTONEL Take 1 tablet (150 mg total) by mouth every 30 (thirty) days. with water on empty stomach, nothing by mouth or lie down for next 30 minutes.       Allergies:  Allergies  Allergen Reactions  . Penicillins Hives and Itching  . Shellfish Allergy   . Sulfa Antibiotics Hives and Itching    Past Medical History:  Diagnosis Date  . Diabetes mellitus without complication ([place])     History reviewed. No pertinent surgical history.  Family History  Problem Relation Age of Onset  . Diabetes Mother   . CAD Mother   . Thyroid disease Neg Hx   . Breast cancer Neg Hx     Social History:  reports that she has never smoked. She has never used smokeless tobacco. She reports that she does not drink alcohol or use drugs.  Review of Systems  DIABETES:  She was diagnosed to have diabetes with an A1c of 7% in March She has been reluctant to take metformin or start checking her sugar However she has seen the dietitian and  has made changes in diet Is trying to walk 30 minutes on most days and has lost weight  Lab glucose was 167 and hour after lunch   Wt Readings from Last 3 Encounters:  09/26/17 157 lb 9.6 oz (71.5 kg)  07/03/17 157 lb 6 oz (71.4 kg)  06/23/17 157 lb (71.2 kg)    Lab Results  Component Value Date   HGBA1C 5.8 (A) 09/26/2017   HGBA1C 7.0 (H) 05/22/2017   Lab Results  Component Value Date   CREATININE 0.63 07/03/2017     EXAM:  BP 116/66 (BP Location: Left Arm, Patient Position: Sitting, Cuff Size: Normal)   Pulse 86   Ht 5\' 4"  (1.626 m)   Wt 157 lb 9.6 oz (71.5 kg)   SpO2 97%   BMI 27.05 kg/m     Assessment/Plan:   HYPERPARATHYROIDISM:   She has mild persistent hypercalcemia since at least 2016 which is stable This was confirmed with inappropriately high PTH at baseline and  finding of a parathyroid adenoma on nuclear scanning previously She also has had significant hypercalciuria but no kidney stones since 2015 Last calcium was 10.9 and will continue observation  OSTEOPOROSIS: T score is -2.6 and she needs to be on pharmacological treatment She explained the need for fracture reduction in sleep with her hyperparathyroidism She probably had some stabilization of her bone density with Boniva previously will start a bisphosphonate again For now we will switch her to Actonel which is likely to be more effective discussed the differences  DIABETES   Blood sugars are better with A1c back to normal with weight loss and dietary changes She is also exercising and will motivated Continue with lifestyle changes alone   Elayne Snare 09/27/2017, 8:17 AM

## 2017-10-10 ENCOUNTER — Ambulatory Visit: Payer: Medicare HMO | Admitting: Gastroenterology

## 2017-10-10 ENCOUNTER — Encounter: Payer: Self-pay | Admitting: Gastroenterology

## 2017-10-10 ENCOUNTER — Other Ambulatory Visit (INDEPENDENT_AMBULATORY_CARE_PROVIDER_SITE_OTHER): Payer: Medicare HMO

## 2017-10-10 VITALS — BP 114/72 | HR 64 | Ht 64.0 in | Wt 154.6 lb

## 2017-10-10 DIAGNOSIS — R945 Abnormal results of liver function studies: Secondary | ICD-10-CM | POA: Diagnosis not present

## 2017-10-10 DIAGNOSIS — R7989 Other specified abnormal findings of blood chemistry: Secondary | ICD-10-CM

## 2017-10-10 DIAGNOSIS — E213 Hyperparathyroidism, unspecified: Secondary | ICD-10-CM | POA: Diagnosis not present

## 2017-10-10 LAB — BASIC METABOLIC PANEL
BUN: 14 mg/dL (ref 6–23)
CO2: 26 mEq/L (ref 19–32)
Calcium: 11.1 mg/dL — ABNORMAL HIGH (ref 8.4–10.5)
Chloride: 105 mEq/L (ref 96–112)
Creatinine, Ser: 0.69 mg/dL (ref 0.40–1.20)
GFR: 90.11 mL/min (ref >60.00)
Glucose, Bld: 93 mg/dL (ref 70–99)
POTASSIUM: 3.9 meq/L (ref 3.5–5.1)
SODIUM: 138 meq/L (ref 135–145)

## 2017-10-10 LAB — HEPATIC FUNCTION PANEL
ALBUMIN: 4.4 g/dL (ref 3.5–5.2)
ALK PHOS: 99 U/L (ref 39–117)
ALT: 33 U/L (ref 0–35)
AST: 28 U/L (ref 0–37)
Bilirubin, Direct: 0.2 mg/dL (ref 0.0–0.3)
Total Bilirubin: 0.7 mg/dL (ref 0.2–1.2)
Total Protein: 8 g/dL (ref 6.0–8.3)

## 2017-10-10 LAB — IRON: Iron: 102 ug/dL (ref 42–145)

## 2017-10-10 LAB — FERRITIN: FERRITIN: 404.2 ng/mL — AB (ref 10.0–291.0)

## 2017-10-10 NOTE — Progress Notes (Signed)
Review of pertinent gastrointestinal problems: 1. Elevated liver tests: Labs 2019: Hepatitis B surface antigen negative, Hep B Surface Ab +, Hep A Ab total +, celiac sprue testing negative, ceruloplasmin level normal, alpha-1 antitrypsin level normal, anti-smooth muscle antibody negative, AMA negative, ANA positive homogeneous pattern at 1:160 titer, ferritin 736, iron 115, sat ratio 36, SPEP normal, Hep C Ab negative. [place] 07/2017: gallstones and fatty liver, otherwise normal.    HPI: This is a very pleasant 67 year old woman whom I last saw here in the office 3 or 4 months ago  Chief complaint is elevated liver tests  She was supposed to get some blood work a few days prior to this appointment but I am not sure she ever got that message.  She says she is having some trouble with her phone system.  She feels fine, no abdominal pains, no jaundice.  ROS: complete GI ROS as described in HPI, all other review negative.  Constitutional:  No unintentional weight loss   Past Medical History:  Diagnosis Date  . Diabetes mellitus without complication ([place])     History reviewed. No pertinent surgical history.  Current Outpatient Medications  Medication Sig Dispense Refill  . Biotin 1 MG CAPS Take by mouth.    Marland Kitchen LOTREL 5-10 MG capsule TK 1 C PO D  1  . Multiple Vitamin (MULTIVITAMIN WITH MINERALS) TABS Take 1 tablet by mouth daily.    . risedronate (ACTONEL) 150 MG tablet Take 1 tablet (150 mg total) by mouth every 30 (thirty) days. with water on empty stomach, nothing by mouth or lie down for next 30 minutes. 3 tablet 1   No current facility-administered medications for this visit.     Allergies as of 10/10/2017 - Review Complete 10/10/2017  Allergen Reaction Noted  . Penicillins Hives and Itching 04/19/2012  . Shellfish allergy  04/19/2012  . Sulfa antibiotics Hives and Itching 04/19/2012    Family History  Problem Relation Age of Onset  . Diabetes Mother   . CAD Mother   . Thyroid  disease Neg Hx   . Breast cancer Neg Hx     Social History   Socioeconomic History  . Marital status: Married    Spouse name: Not on file  . Number of children: Not on file  . Years of education: Not on file  . Highest education level: Not on file  Occupational History  . Not on file  Social Needs  . Financial resource strain: Not on file  . Food insecurity:    Worry: Not on file    Inability: Not on file  . Transportation needs:    Medical: Not on file    Non-medical: Not on file  Tobacco Use  . Smoking status: Never Smoker  . Smokeless tobacco: Never Used  Substance and Sexual Activity  . Alcohol use: No  . Drug use: No  . Sexual activity: Not on file  Lifestyle  . Physical activity:    Days per week: Not on file    Minutes per session: Not on file  . Stress: Not on file  Relationships  . Social connections:    Talks on phone: Not on file    Gets together: Not on file    Attends religious service: Not on file    Active member of club or organization: Not on file    Attends meetings of clubs or organizations: Not on file    Relationship status: Not on file  . Intimate partner violence:  Fear of current or ex partner: Not on file    Emotionally abused: Not on file    Physically abused: Not on file    Forced sexual activity: Not on file  Other Topics Concern  . Not on file  Social History Narrative  . Not on file     Physical Exam: Wt 154 lb 9.6 oz (70.1 kg)   BMI 26.54 kg/m  Constitutional: generally well-appearing Psychiatric: alert and oriented x3 Abdomen: soft, nontender, nondistended, no obvious ascites, no peritoneal signs, normal bowel sounds No peripheral edema noted in lower extremities  Assessment and plan: 67 y.o. female with elevated liver tests  Ferritin was elevated, other iron studies normal.  ANA was elevated but her SPEP was normal.  She is going to have some repeat blood work today including a ANA, iron tests, LFTs.  Pending those  results she may need further testing or work-up.  Please see the "Patient Instructions" section for addition details about the plan.  Owens Loffler, MD [facility] 10/10/2017, 3:50 PM

## 2017-10-10 NOTE — Patient Instructions (Addendum)
You will have labs checked today in the basement lab.  Please head down after you check out with the front desk  (ANA, iron, TIBC, ferritin, LFTs)  Normal BMI (Body Mass Index- based on height and weight) is between 23 and 30. Your BMI today is Body mass index is 26.54 kg/m. Marland Kitchen Please consider follow up  regarding your BMI with your Primary Care Provider.

## 2017-10-11 LAB — IRON,TIBC AND FERRITIN PANEL
%SAT: 34 % (calc) (ref 16–45)
FERRITIN: 540 ng/mL — AB (ref 16–288)
IRON: 104 ug/dL (ref 45–160)
TIBC: 306 mcg/dL (calc) (ref 250–450)

## 2017-10-13 LAB — ANTI-NUCLEAR AB-TITER (ANA TITER): ANA Titer 1: 1:80 {titer} — ABNORMAL HIGH

## 2017-10-13 LAB — ANA: Anti Nuclear Antibody(ANA): POSITIVE — AB

## 2017-10-24 DIAGNOSIS — E119 Type 2 diabetes mellitus without complications: Secondary | ICD-10-CM | POA: Insufficient documentation

## 2017-10-25 ENCOUNTER — Telehealth: Payer: Self-pay | Admitting: Gastroenterology

## 2017-10-25 NOTE — Telephone Encounter (Signed)
Dr Ardis Hughs have you reviewed labs for this pt?

## 2017-10-26 ENCOUNTER — Other Ambulatory Visit: Payer: Self-pay

## 2017-10-26 DIAGNOSIS — R945 Abnormal results of liver function studies: Principal | ICD-10-CM

## 2017-10-26 DIAGNOSIS — R7989 Other specified abnormal findings of blood chemistry: Secondary | ICD-10-CM

## 2017-10-26 NOTE — Telephone Encounter (Signed)
See results note. 

## 2018-03-14 ENCOUNTER — Ambulatory Visit: Admission: RE | Admit: 2018-03-14 | Payer: Medicare HMO | Source: Ambulatory Visit

## 2018-03-14 ENCOUNTER — Other Ambulatory Visit: Payer: Self-pay | Admitting: Physician Assistant

## 2018-03-14 ENCOUNTER — Ambulatory Visit
Admission: RE | Admit: 2018-03-14 | Discharge: 2018-03-14 | Disposition: A | Discharge status: Home / Self Care | Payer: Medicare HMO | Source: Ambulatory Visit | Attending: Physician Assistant | Admitting: Physician Assistant

## 2018-03-14 DIAGNOSIS — R921 Mammographic calcification found on diagnostic imaging of breast: Secondary | ICD-10-CM

## 2018-04-11 ENCOUNTER — Other Ambulatory Visit: Payer: Self-pay | Admitting: Endocrinology

## 2018-04-11 DIAGNOSIS — E559 Vitamin D deficiency, unspecified: Secondary | ICD-10-CM

## 2018-04-11 DIAGNOSIS — E213 Hyperparathyroidism, unspecified: Secondary | ICD-10-CM

## 2018-04-11 DIAGNOSIS — R7301 Impaired fasting glucose: Secondary | ICD-10-CM

## 2018-04-16 ENCOUNTER — Other Ambulatory Visit: Payer: Medicare HMO

## 2018-04-19 ENCOUNTER — Ambulatory Visit: Payer: Medicare HMO | Admitting: Endocrinology

## 2018-04-24 ENCOUNTER — Other Ambulatory Visit: Payer: Medicare HMO

## 2018-04-24 ENCOUNTER — Other Ambulatory Visit (INDEPENDENT_AMBULATORY_CARE_PROVIDER_SITE_OTHER): Payer: Medicare HMO

## 2018-04-24 DIAGNOSIS — R7301 Impaired fasting glucose: Secondary | ICD-10-CM | POA: Diagnosis not present

## 2018-04-24 DIAGNOSIS — E559 Vitamin D deficiency, unspecified: Secondary | ICD-10-CM | POA: Diagnosis not present

## 2018-04-24 DIAGNOSIS — E213 Hyperparathyroidism, unspecified: Secondary | ICD-10-CM

## 2018-04-24 LAB — COMPREHENSIVE METABOLIC PANEL
ALBUMIN: 4.2 g/dL (ref 3.5–5.2)
ALK PHOS: 116 U/L (ref 39–117)
ALT: 58 U/L — ABNORMAL HIGH (ref 0–35)
AST: 45 U/L — AB (ref 0–37)
BILIRUBIN TOTAL: 0.4 mg/dL (ref 0.2–1.2)
BUN: 15 mg/dL (ref 6–23)
CALCIUM: 11.1 mg/dL — AB (ref 8.4–10.5)
CHLORIDE: 104 meq/L (ref 96–112)
CO2: 25 meq/L (ref 19–32)
Creatinine, Ser: 0.69 mg/dL (ref 0.40–1.20)
GFR: 84.64 mL/min (ref >60.00)
Glucose, Bld: 99 mg/dL (ref 70–99)
Potassium: 4.1 mEq/L (ref 3.5–5.1)
Sodium: 138 mEq/L (ref 135–145)
Total Protein: 7.7 g/dL (ref 6.0–8.3)

## 2018-04-24 LAB — VITAMIN D 25 HYDROXY (VIT D DEFICIENCY, FRACTURES): VITD: 19.39 ng/mL — ABNORMAL LOW (ref 30.00–100.00)

## 2018-04-24 LAB — HEMOGLOBIN A1C: HEMOGLOBIN A1C: 6.4 % (ref 4.6–6.5)

## 2018-04-27 ENCOUNTER — Ambulatory Visit: Payer: Medicare HMO | Admitting: Endocrinology

## 2018-04-27 ENCOUNTER — Encounter: Payer: Self-pay | Admitting: Endocrinology

## 2018-04-27 VITALS — BP 122/76 | HR 87 | Ht 64.0 in | Wt 161.4 lb

## 2018-04-27 DIAGNOSIS — E213 Hyperparathyroidism, unspecified: Secondary | ICD-10-CM | POA: Diagnosis not present

## 2018-04-27 DIAGNOSIS — E119 Type 2 diabetes mellitus without complications: Secondary | ICD-10-CM

## 2018-04-27 DIAGNOSIS — E559 Vitamin D deficiency, unspecified: Secondary | ICD-10-CM

## 2018-04-27 DIAGNOSIS — M810 Age-related osteoporosis without current pathological fracture: Secondary | ICD-10-CM | POA: Diagnosis not present

## 2018-04-27 NOTE — Patient Instructions (Signed)
VITAMIN D3 5000 UNITS DAILY with food  Walk daily  RECLAST yearly shot: check

## 2018-04-27 NOTE — Progress Notes (Signed)
Patient ID: Jillian Powell, female   DOB: 1950/08/09, 68 y.o.   MRN: [id number]          Chief complaint: Endocrinology follow-up  History of Present Illness:   HYPERCALCEMIA:  Review of her labs show that she has had a high calcium since 11/16 Previously highest calcium 11.4 Most recent calcium level is stable at 11.1  Lab Results  Component Value Date   CALCIUM 11.1 (H) 04/24/2018   CALCIUM 11.1 (H) 10/10/2017   CALCIUM 10.9 (H) 07/03/2017   .  She did have kidney stones in 2015 but no recurrence Previously had high 24 urine calcium Another 24 urine calcium checked which was normal but her collection was inadequate with low amount of creatinine in the measurement  Prior serologic and radiologic studies have included:   PTH levels: In 09/2015 = 46 and in 11/17 = 24  01/26/2016: 24 urine calcium =526.4 (H)  25 (OH) Vitamin D level, last was 29.5 and 1, 25 hydroxy vitamin D was 43.7   Parathyroid scan in done in 03/2016: Mild activity corresponding to 5 x 5 x 3 mm nodular density posterior to the upper pole of the right thyroid lobe. These findings suggest the possibility of small parathyroid adenoma at this site.    OSTEOPOROSIS:  BONE density done on 08/16/2017 showed:   Lumbar spine L1-L4 Femoral neck (FN) 33% distal radius  T-score -2.6 RFN: -2.3 LFN: -2.4 n/a  Change in BMD from previous DXA test (%) n/a n/a n/a   Previous bone density in 2017 showed spine T score -2.5  Prior treatment has included boniva for 3 years, not clear of the starting and stopping dates, probably stopped in 2017 In 2019 she was given risedronate 150 mg monthly but she stopped this because of high cost and did not call for replacement  Also not taking any vitamin D recently   Lab Results  Component Value Date   VD25OH 19.39 (L) 04/24/2018   VD25OH 24.67 (L) 05/22/2017       Allergies as of 04/27/2018      Reactions   Penicillins Hives, Itching   Shellfish Allergy    Sulfa  Antibiotics Hives, Itching      Medication List       Accurate as of April 27, 2018  2:41 PM. Always use your most recent med list.        Biotin 1 MG Caps Take by mouth.   LOTREL 5-10 MG capsule Generic drug:  amLODipine-benazepril TK 1 C PO D   multivitamin with minerals Tabs tablet Take 1 tablet by mouth daily.   risedronate 150 MG tablet Commonly known as:  ACTONEL Take 1 tablet (150 mg total) by mouth every 30 (thirty) days. with water on empty stomach, nothing by mouth or lie down for next 30 minutes.       Allergies:  Allergies  Allergen Reactions  . Penicillins Hives and Itching  . Shellfish Allergy   . Sulfa Antibiotics Hives and Itching    Past Medical History:  Diagnosis Date  . Diabetes mellitus without complication ([place])     History reviewed. No pertinent surgical history.  Family History  Problem Relation Age of Onset  . Diabetes Mother   . CAD Mother   . Thyroid disease Neg Hx   . Breast cancer Neg Hx     Social History:  reports that she has never smoked. She has never used smokeless tobacco. She reports that she does not drink alcohol or  use drugs.  Review of Systems  DIABETES:  She was diagnosed to have diabetes with an A1c of 7% in March 2019 She has been reluctant to take metformin However she has seen the dietitian in 2019 Although subsequently she did much better with A1c as low as 5.8 she has gained back some weight and A1c is higher again  She has not done well with the diet with poor choices and less portion control Also not exercising much now  Lab glucose was 99   Wt Readings from Last 3 Encounters:  04/27/18 161 lb 6.4 oz (73.2 kg)  10/10/17 154 lb 9.6 oz (70.1 kg)  09/26/17 157 lb 9.6 oz (71.5 kg)    Lab Results  Component Value Date   HGBA1C 6.4 04/24/2018   HGBA1C 5.8 (A) 09/26/2017   HGBA1C 7.0 (H) 05/22/2017   Lab Results  Component Value Date   CREATININE 0.69 04/24/2018     EXAM:  BP 122/76  (BP Location: Left Arm, Patient Position: Sitting, Cuff Size: Normal)   Pulse 87   Ht 5\' 4"  (1.626 m)   Wt 161 lb 6.4 oz (73.2 kg)   SpO2 96%   BMI 27.70 kg/m     Assessment/Plan:   HYPERPARATHYROIDISM:  She has mild persistent hypercalcemia since at least 2016 which is stable This was confirmed with inappropriately high PTH at baseline and finding of a parathyroid adenoma on nuclear scanning previously She also has had significant hypercalciuria initially but no kidney stones since 2015  Since she is asymptomatic and calcium is stable at 11.1 she is a candidate for observation only  OSTEOPOROSIS: T score is -2.6 and she needs to be on pharmacological treatment She stopped taking risedronate that was given last year and did not report this Given her the options of taking alendronate that would be less expensive for IV Reclast Explained to her in detail how IV Reclast works, benefits, possible side effects and answered all her questions about this  Discussed differences between oral and IV bisphosphonates and she will think about it and let us know her choice We will also need periodic bone density assessment  VITAMIN D deficiency: Discussed importance of supplementation and since her level is significantly low she will start with 5000 units OTC daily   DIABETES   Blood sugars getting relatively higher although A1c is still under 6.5 She thinks she can do better with consistent diet as before and start regular exercise with at least walking Discussed A1c and blood sugar goals  May consider metformin if she is not improving  Total visit time for evaluation and management of multiple problems and counseling =25 minutes   Patient Instructions  VITAMIN D3 5000 UNITS DAILY with food  Walk daily  RECLAST yearly shot: check     Elayne Snare 04/27/2018, 2:41 PM

## 2018-09-24 ENCOUNTER — Ambulatory Visit
Admission: RE | Admit: 2018-09-24 | Discharge: 2018-09-24 | Disposition: A | Discharge status: Home / Self Care | Payer: Medicare HMO | Source: Ambulatory Visit | Attending: Physician Assistant | Admitting: Physician Assistant

## 2018-09-24 ENCOUNTER — Other Ambulatory Visit: Payer: Self-pay

## 2018-09-24 DIAGNOSIS — R921 Mammographic calcification found on diagnostic imaging of breast: Secondary | ICD-10-CM

## 2019-03-29 ENCOUNTER — Encounter: Payer: Self-pay | Admitting: Gastroenterology

## 2019-03-29 ENCOUNTER — Other Ambulatory Visit (INDEPENDENT_AMBULATORY_CARE_PROVIDER_SITE_OTHER): Payer: Medicare HMO

## 2019-03-29 ENCOUNTER — Ambulatory Visit: Payer: Medicare HMO | Admitting: Gastroenterology

## 2019-03-29 VITALS — BP 120/86 | HR 88 | Temp 98.7°F | Ht 64.0 in | Wt 158.0 lb

## 2019-03-29 DIAGNOSIS — R7989 Other specified abnormal findings of blood chemistry: Secondary | ICD-10-CM | POA: Diagnosis not present

## 2019-03-29 LAB — COMPREHENSIVE METABOLIC PANEL
ALT: 37 U/L — ABNORMAL HIGH (ref 0–35)
AST: 33 U/L (ref 0–37)
Albumin: 4.2 g/dL (ref 3.5–5.2)
Alkaline Phosphatase: 111 U/L (ref 39–117)
BUN: 17 mg/dL (ref 6–23)
CO2: 27 mEq/L (ref 19–32)
Calcium: 11 mg/dL — ABNORMAL HIGH (ref 8.4–10.5)
Chloride: 105 mEq/L (ref 96–112)
Creatinine, Ser: 0.7 mg/dL (ref 0.40–1.20)
GFR: 83.02 mL/min (ref >60.00)
Glucose, Bld: 119 mg/dL — ABNORMAL HIGH (ref 70–99)
Potassium: 4 mEq/L (ref 3.5–5.1)
Sodium: 140 mEq/L (ref 135–145)
Total Bilirubin: 0.5 mg/dL (ref 0.2–1.2)
Total Protein: 7.7 g/dL (ref 6.0–8.3)

## 2019-03-29 LAB — FERRITIN: Ferritin: 412 ng/mL — ABNORMAL HIGH (ref 10.0–291.0)

## 2019-03-29 NOTE — Patient Instructions (Signed)
If you are age 69 or older, your body mass index should be between 23-30. Your Body mass index is 27.12 kg/m. If this is out of the aforementioned range listed, please consider follow up with your Primary Care Provider.  If you are age 68 or younger, your body mass index should be between 19-25. Your Body mass index is 27.12 kg/m. If this is out of the aformentioned range listed, please consider follow up with your Primary Care Provider.   Your provider has requested that you go to the basement level for lab work before leaving today. Press "B" on the elevator. The lab is located at the first door on the left as you exit the elevator.  You have been scheduled for an abdominal ultrasound at [facility] (1st floor of hospital) on 04/03/19 at 10:30am. Please arrive 15 minutes prior to your appointment for registration. Make certain not to have anything to eat or drink after midnight the night prior to your appointment. Should you need to reschedule your appointment, please contact radiology at [phone]. This test typically takes about 30 minutes to perform.  Due to recent changes in healthcare laws, you may see the results of your imaging and laboratory studies on MyChart before your provider has had a chance to review them.  We understand that in some cases there may be results that are confusing or concerning to you. Not all laboratory results come back in the same time frame and the provider may be waiting for multiple results in order to interpret others.  Please give [place] 48 hours in order for your provider to thoroughly review all the results before contacting the office for clarification of your results.

## 2019-03-29 NOTE — Progress Notes (Signed)
Review of pertinent gastrointestinal problems: 1. Elevated liver tests: Labs 2019: Hepatitis B surface antigen negative, Hep B Surface Ab +, Hep A Ab total +, celiac sprue testing negative, ceruloplasmin level normal, alpha-1 antitrypsin level normal, anti-smooth muscle antibody negative, AMA negative, ANA positive homogeneous pattern at 1:160 titer, ferritin 736, iron 115, sat ratio 36, SPEP normal, Hep C Ab negative. [place] 07/2017: gallstones and fatty liver; August 2019 ferritin 540, ANA positive at 1:80 titer   HPI: [name]  She has no symptoms of liver disease.  Specifically no jaundice, no skin significant epigastric or right upper quadrant pains, no edema.  She had repeat liver testing August 2019 ferritin was 540, her transaminases had completely normalized, her ANA was still positive however the lower titer 1-80  Outside lab testing November 2020 shows alk phos 141, AST 58, ALT 60   Chief complaint is elevated liver tests  ROS: complete GI ROS as described in HPI, all other review negative.  Constitutional:  No unintentional weight loss   Past Medical History:  Diagnosis Date  . Diabetes mellitus without complication ([place])   . Elevated liver enzymes     Past Surgical History:  Procedure Laterality Date  . None      Current Outpatient Medications  Medication Sig Dispense Refill  . Biotin 1 MG CAPS Take by mouth.    . levocetirizine (XYZAL) 5 MG tablet as needed.     Marland Kitchen LOTREL 5-10 MG capsule daily.   1  . Multiple Vitamin (MULTIVITAMIN WITH MINERALS) TABS Take 1 tablet by mouth daily.     No current facility-administered medications for this visit.    Allergies as of 03/29/2019 - Review Complete 03/29/2019  Allergen Reaction Noted  . Penicillins Hives and Itching 04/19/2012  . Shellfish allergy  04/19/2012  . Sulfa antibiotics Hives and Itching 04/19/2012    Family History  Problem Relation  Age of Onset  . Diabetes Mother   . CAD Mother   . Other Father        old age died at 30  . Thyroid disease Neg Hx   . Breast cancer Neg Hx   . Colon cancer Neg Hx   . Liver cancer Neg Hx     Social History   Socioeconomic History  . Marital status: Married    Spouse name: Not on file  . Number of children: 4  . Years of education: Not on file  . Highest education level: Not on file  Occupational History  . Occupation: retired  Tobacco Use  . Smoking status: Never Smoker  . Smokeless tobacco: Never Used  Substance and Sexual Activity  . Alcohol use: No  . Drug use: No  . Sexual activity: Not on file  Other Topics Concern  . Not on file  Social History Narrative  . Not on file   Social Determinants of Health   Financial Resource Strain:   . Difficulty of Paying Living Expenses: Not on file  Food Insecurity:   . Worried About Charity fundraiser in the Last Year: Not on file  . Ran Out of Food in the Last Year: Not on file  Transportation Needs:   . Lack of Transportation (Medical): Not on file  . Lack of Transportation (Non-Medical): Not on file  Physical Activity:   . Days of Exercise per Week: Not on file  . Minutes of Exercise per Session: Not  on file  Stress:   . Feeling of Stress : Not on file  Social Connections:   . Frequency of Communication with Friends and Family: Not on file  . Frequency of Social Gatherings with Friends and Family: Not on file  . Attends Religious Services: Not on file  . Active Member of Clubs or Organizations: Not on file  . Attends Archivist Meetings: Not on file  . Marital Status: Not on file  Intimate Partner Violence:   . Fear of Current or Ex-Partner: Not on file  . Emotionally Abused: Not on file  . Physically Abused: Not on file  . Sexually Abused: Not on file     Physical Exam: BP 120/86   Pulse 88   Temp 98.7 F (37.1 C)   Ht _0  (1.626 m)   Wt 158 lb (71.7 kg)   BMI 27.12 kg/m   Constitutional: generally well-appearing Psychiatric: alert and oriented x3 Abdomen: soft, nontender, nondistended, no obvious ascites, no peritoneal signs, normal bowel sounds No peripheral edema noted in lower extremities  Assessment and plan: 69 y.o. female with elevated liver tests  Possibly her elevated liver tests are due to fatty liver disease however she really is not obese with a BMI of 27 and have also been somewhat concerned about slightly elevated ANA level and elevated ferritin.  I recommended repeat LFTs, ANA, ferritin and right upper quadrant  ultrasound.  We did not arrange scheduled follow-up as it would depend on her repeat liver test.  I did mention to her that it would be possible in the future might recommend a liver biopsy to understand her process better.  Certainly she should continue to try to lose weight and maintain a low-carb diet.  Please see the "Powell Instructions" section for addition details about the plan.  Owens Loffler, MD [facility] 03/29/2019, 10:01 AM   Total time on date of encounter was 25 minutes  (this included time spent preparing to see the Powell reviewing records; obtaining and/or reviewing separately obtained history; performing a medically appropriate exam and/or evaluation; counseling and educating the Powell and family if present; ordering medications, tests or procedures if applicable; and documenting clinical information in the health record).

## 2019-03-31 LAB — ANA: Anti Nuclear Antibody (ANA): POSITIVE — AB

## 2019-03-31 LAB — ANTI-NUCLEAR AB-TITER (ANA TITER): ANA Titer 1: 1:80 {titer} — ABNORMAL HIGH

## 2019-04-03 ENCOUNTER — Ambulatory Visit (HOSPITAL_COMMUNITY)
Admission: RE | Admit: 2019-04-03 | Discharge: 2019-04-03 | Disposition: A | Discharge status: Home / Self Care | Payer: Medicare HMO | Source: Ambulatory Visit | Attending: Gastroenterology | Admitting: Gastroenterology

## 2019-04-03 DIAGNOSIS — R7989 Other specified abnormal findings of blood chemistry: Secondary | ICD-10-CM

## 2019-04-16 ENCOUNTER — Ambulatory Visit (HOSPITAL_COMMUNITY): Payer: Medicare HMO | Attending: Gastroenterology

## 2019-07-13 ENCOUNTER — Emergency Department (HOSPITAL_COMMUNITY)
Admission: EM | Admit: 2019-07-13 | Discharge: 2019-07-13 | Disposition: A | Discharge status: Home / Self Care | Payer: Medicare HMO | Attending: Emergency Medicine | Admitting: Emergency Medicine

## 2019-07-13 ENCOUNTER — Other Ambulatory Visit: Payer: Self-pay

## 2019-07-13 ENCOUNTER — Emergency Department (HOSPITAL_COMMUNITY): Payer: Medicare HMO

## 2019-07-13 ENCOUNTER — Encounter (HOSPITAL_COMMUNITY): Payer: Self-pay | Admitting: Emergency Medicine

## 2019-07-13 DIAGNOSIS — M25562 Pain in left knee: Secondary | ICD-10-CM | POA: Insufficient documentation

## 2019-07-13 DIAGNOSIS — E119 Type 2 diabetes mellitus without complications: Secondary | ICD-10-CM | POA: Insufficient documentation

## 2019-07-13 DIAGNOSIS — Z79899 Other long term (current) drug therapy: Secondary | ICD-10-CM | POA: Insufficient documentation

## 2019-07-13 DIAGNOSIS — M7122 Synovial cyst of popliteal space [Baker], left knee: Secondary | ICD-10-CM | POA: Diagnosis not present

## 2019-07-13 MED ORDER — HYDROCODONE-ACETAMINOPHEN 5-325 MG PO TABS
1.0000 | ORAL_TABLET | Freq: Once | ORAL | Status: AC
  Administered 2019-07-13: 1 via ORAL
  Filled 2019-07-13: qty 1

## 2019-07-13 MED ORDER — HYDROCODONE-ACETAMINOPHEN 5-325 MG PO TABS: 1.0000 | ORAL_TABLET | Freq: Four times a day (QID) | ORAL | 0 refills | Status: AC | PRN

## 2019-07-13 NOTE — ED Notes (Signed)
Pt was able move self from bed to wheelchair independently.

## 2019-07-13 NOTE — ED Triage Notes (Signed)
Pt c/o severe L knee pain, pt seen at [facility] for same 2 days ago, hx of DVT, u/s done showing small popliteal cyst. Pt states yesterday she felt/heard pop and now is unable to stand or move knee d/t severe pain.

## 2019-07-13 NOTE — ED Notes (Signed)
XR at bedside

## 2019-07-13 NOTE — Discharge Instructions (Addendum)
You were seen today for left knee pain. Your xray was reassuring and showed no signs of fracture, break or significant arthritis. We think your pain is from your bakers cyst. For this you should continue to wear the brace, use crutches, take motrin and follow up with orthopedics for further workup including possible MRI, injections or aspiration of the cyst if rest and medications do not help. We have also prescribed a a pain medication called norco. This is a controlled substance and should only be taken for severe pain. It may cause drowsiness, dizziness, and should not be taken hen driving or operating heavy machinery. It is important that if you get a fever, the swelling gets worse or you have any other new concerning symptoms to return to the ER or primary care ASAP to be re-evaluated.

## 2019-07-13 NOTE — ED Notes (Signed)
Patient Alert and oriented to baseline. Stable and ambulatory to baseline. Patient verbalized understanding of the discharge instructions.  Patient belongings were taken by the patient.   

## 2019-07-13 NOTE — ED Provider Notes (Signed)
[facility] EMERGENCY DEPARTMENT Provider Note   CSN: [id number] Arrival date & time: 07/13/19  0111     History Chief Complaint  Patient presents with  . Knee Pain    Jillian Powell is a 69 y.o. female.  Patient is a 69 year old female with past medical history of diabetes presents to the emergency department for left knee pain.  Patient reports that she has had pain for about a week.  Denies any injury or trauma.  Reports the pain is in the posterior aspect of her knee mostly.  She did see her primary care doctor for this who obtained an x-ray and a DVT study.  X-ray was reassuring and ultrasound showed she has a popliteal cyst.  She has been taking ibuprofen but reports the pain is worsening to the point where she is using crutches.  Reports mild swelling but nothing significant.  Denies any fever, chills, numbness, tingling, weakness.  She reports she called her doctor to follow-up with them but they told her to come to the emergency department        Past Medical History:  Diagnosis Date  . Diabetes mellitus without complication ([place])   . Elevated liver enzymes     Patient Active Problem List   Diagnosis Date Noted  . Hyperparathyroidism ([place]) 09/26/2017    Past Surgical History:  Procedure Laterality Date  . None       OB History   No obstetric history on file.     Family History  Problem Relation Age of Onset  . Diabetes Mother   . CAD Mother   . Other Father        old age died at 78  . Thyroid disease Neg Hx   . Breast cancer Neg Hx   . Colon cancer Neg Hx   . Liver cancer Neg Hx     Social History   Tobacco Use  . Smoking status: Never Smoker  . Smokeless tobacco: Never Used  Substance Use Topics  . Alcohol use: No  . Drug use: No    Home Medications Prior to Admission medications   Medication Sig Start Date End Date Taking? Authorizing Provider  ASCORBIC ACID PO Take 1 tablet by mouth daily.   Yes [provider]  Cholecalciferol 25 MCG (1000 UT) tablet Take 1,000 Units by mouth daily.   Yes [provider]  ibuprofen (ADVIL) 200 MG tablet Take 600 mg by mouth 2 (two) times daily as needed for headache or moderate pain.   Yes [provider]  levocetirizine (XYZAL) 5 MG tablet Take 5 mg by mouth daily as needed for allergies.  02/26/19  Yes [provider]  LOTREL 5-10 MG capsule Take 1 capsule by mouth daily.  04/18/17  Yes [provider]  Multiple Vitamins-Minerals (ZINC PO) Take 1 capsule by mouth daily.   Yes [provider]  Polyethyl Glycol-Propyl Glycol (SYSTANE) 0.4-0.3 % SOLN Place 1 drop into the right eye daily as needed (Dry eyes).   Yes [provider]  HYDROcodone-acetaminophen (NORCO/VICODIN) 5-325 MG tablet Take 1 tablet by mouth every 6 (six) hours as needed for up to 2 days for severe pain. 07/13/19 07/15/19  Madilyn Hook A, PA-C    Allergies    Shellfish allergy, Penicillins, and Sulfa antibiotics  Review of Systems   Review of Systems  Constitutional: Negative for fever.  Respiratory: Negative for shortness of breath.   Musculoskeletal: Positive for arthralgias and gait problem. Negative  for back pain.  Skin: Negative for color change, rash and wound.  Neurological: Negative for weakness and numbness.    Physical Exam Updated Vital Signs BP 122/74   Pulse 62   Temp 98.1 F (36.7 C) (Oral)   Resp 19   SpO2 95%   Physical Exam Vitals and nursing note reviewed.  Constitutional:      General: She is not in acute distress.    Appearance: Normal appearance. She is not ill-appearing, toxic-appearing or diaphoretic.  HENT:     Head: Normocephalic.  Eyes:     Conjunctiva/sclera: Conjunctivae normal.  Pulmonary:     Effort: Pulmonary effort is normal.  Musculoskeletal:     Left knee: Swelling (very mild lateral and posterior swelling) present. No deformity, effusion, erythema, ecchymosis, lacerations, bony  tenderness or crepitus. Decreased range of motion. Tenderness (posterior knee) present. No medial joint line, lateral joint line or MCL tenderness. Normal alignment and normal patellar mobility. Normal pulse.     Left lower leg: Normal.     Left ankle: Normal.     Comments: Pain with flexion of the knee, difficult to examine to due to pain  Skin:    General: Skin is dry.     Capillary Refill: Capillary refill takes less than 2 seconds.  Neurological:     Mental Status: She is alert.  Psychiatric:        Mood and Affect: Mood normal.     ED Results / Procedures / Treatments   Labs (all labs ordered are listed, but only abnormal results are displayed) Labs Reviewed - No data to display  EKG None  Radiology DG Knee Complete 4 Views Left  Result Date: 07/13/2019 CLINICAL DATA:  Initial evaluation for acute knee pain. EXAM: LEFT KNEE - COMPLETE 4+ VIEW COMPARISON:  None. FINDINGS: No acute fracture dislocation. No joint effusion. Joint spaces fairly well-maintained without evidence for significant degenerative or erosive arthropathy. Mild osteopenia. No soft tissue abnormality. IMPRESSION: No acute osseous abnormality about the left knee. Electronically Signed   By: Jeannine Boga M.D.   On: 07/13/2019 07:09    Procedures Procedures (including critical care time)  Medications Ordered in ED Medications  HYDROcodone-acetaminophen (NORCO/VICODIN) 5-325 MG per tablet 1 tablet (1 tablet Oral Given 07/13/19 [id number])    ED Course  I have reviewed the triage vital signs and the nursing notes.  Pertinent labs & imaging results that were available during my care of the patient were reviewed by me and considered in my medical decision making (see chart for details).  Clinical Course as of Jul 13 734  Sat Jul 13, 2019  0707 Patient with atraumatic knee pain. Normal xray and recent doppler 2 days ago showing bakers cysts. Knee appears ever so slightly swollen but other wise normal  appearing. Skin is normal. Posterior knee is tender. Suspect pain is from popliteal cyst and have no concern for septic joint, gout/pseudogout. She does not have an effusion to be drained. Will advised RICE treatment. She has a brace and crutches. Continue motrin and add norco and referral to orthopedics. Advised on return precautions.   [KM]    Clinical Course User Index [KM] Kristine Royal   MDM Rules/Calculators/A&P                      Based on review of vitals, medical screening exam, lab work and/or imaging, there does not appear to be an acute, emergent etiology for the patient's symptoms. Counseled  pt on good return precautions and encouraged both PCP and ED follow-up as needed.  Prior to discharge, I also discussed incidental imaging findings with patient in detail and advised appropriate, recommended follow-up in detail.  Clinical Impression: 1. Acute pain of left knee   2. Synovial cyst of left popliteal space     Disposition: Discharge  Prior to providing a prescription for a controlled substance, I independently reviewed the patient's recent prescription history on the [place]. The patient had no recent or regular prescriptions and was deemed appropriate for a brief, less than 3 day prescription of narcotic for acute analgesia.  This note was prepared with assistance of Systems analyst. Occasional wrong-word or sound-a-like substitutions may have occurred due to the inherent limitations of voice recognition software.  Final Clinical Impression(s) / ED Diagnoses Final diagnoses:  Acute pain of left knee  Synovial cyst of left popliteal space    Rx / DC Orders ED Discharge Orders         Ordered    HYDROcodone-acetaminophen (NORCO/VICODIN) 5-325 MG tablet  Every 6 hours PRN     07/13/19 0736           Alveria Apley, PA-C 07/13/19 [id number]    Malvin Johns, MD 07/13/19 [phone]

## 2019-08-11 IMAGING — MG DIGITAL DIAGNOSTIC UNILATERAL RIGHT MAMMOGRAM WITH TOMO AND CAD
8 series · 8 of 20 positions shown · non-contrast
Comparison: Previous exam(s).

CLINICAL DATA: Patient recalled from screening for right breast
calcifications and right breast asymmetry.

EXAM:
DIGITAL DIAGNOSTIC RIGHT MAMMOGRAM WITH CAD

[R ML]
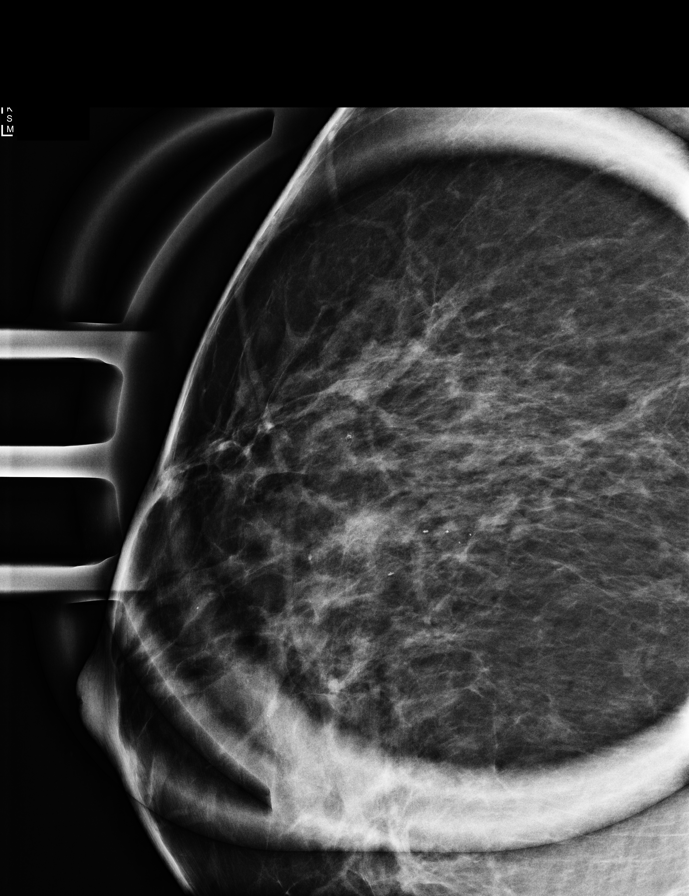

[R CC]
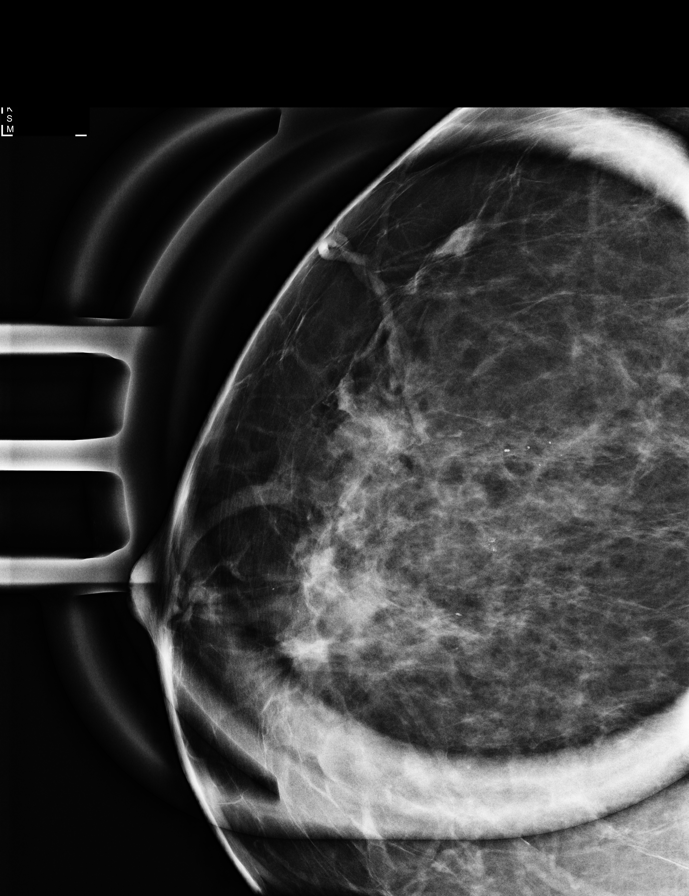

[R CC synth-2D (1 of 2)]
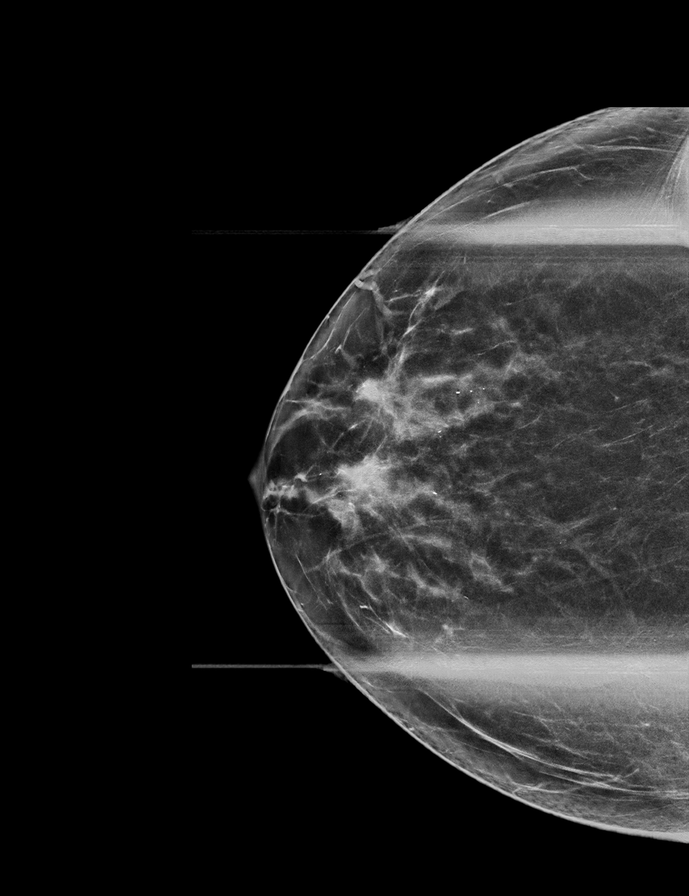

[R CC synth-2D (2 of 2)]
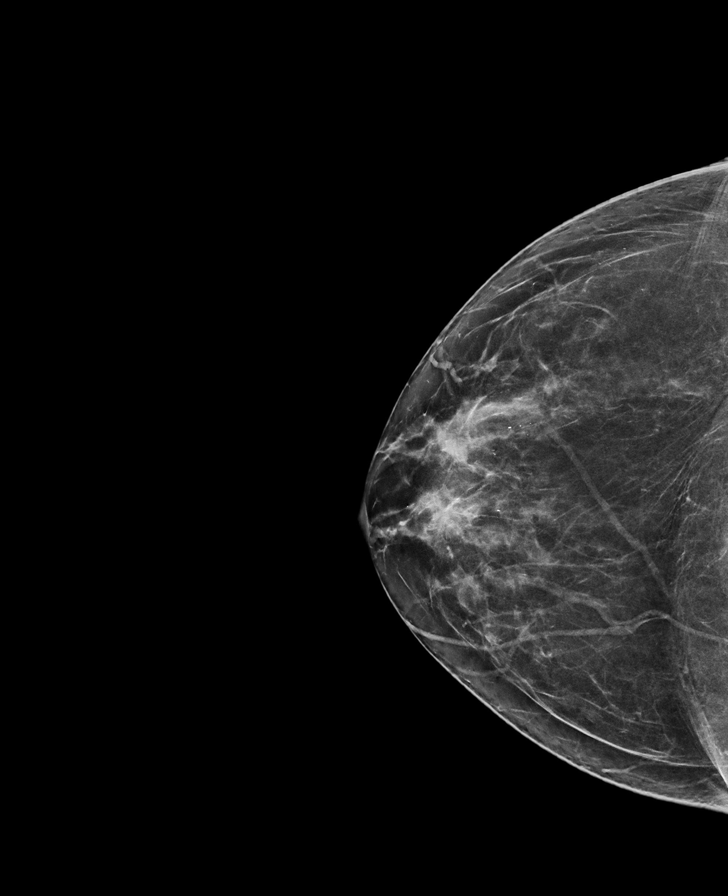

[R MLO synth-2D]
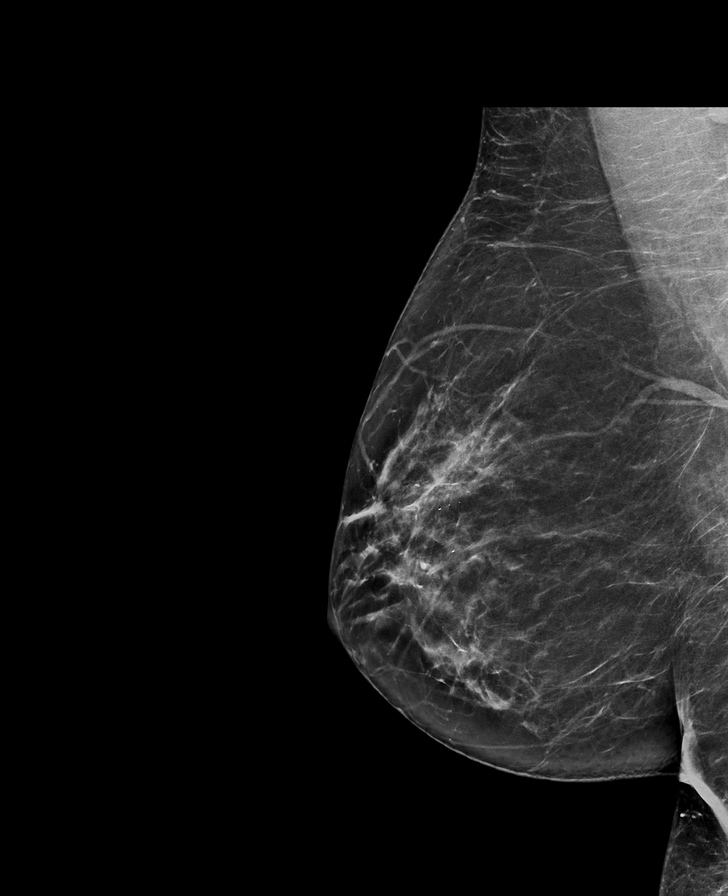

[R CC tomo (1 of 2) · tomo slice 35/69.0]
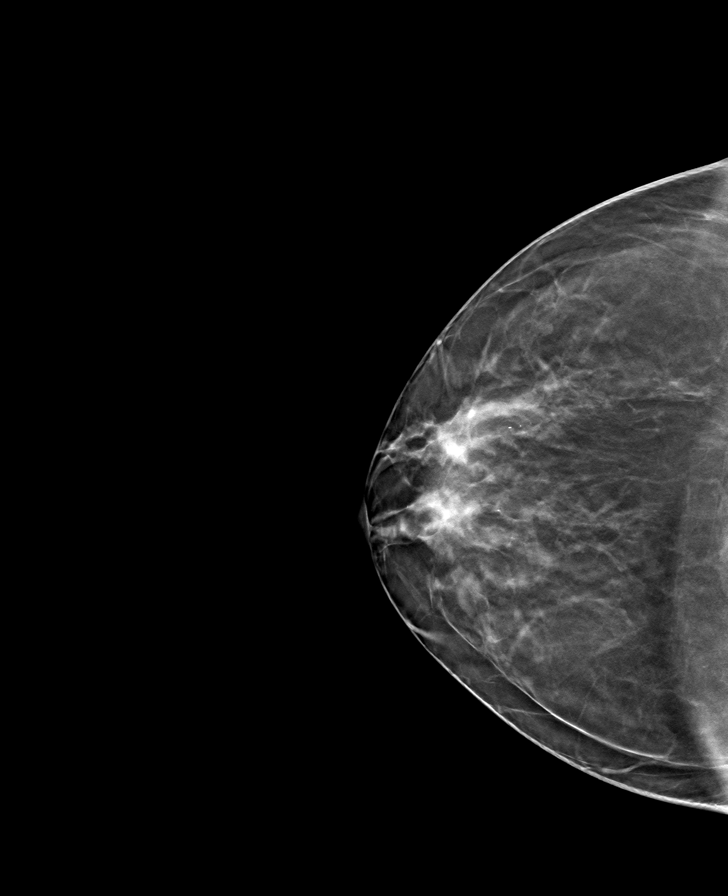

[R MLO tomo · tomo slice 37/73.0]
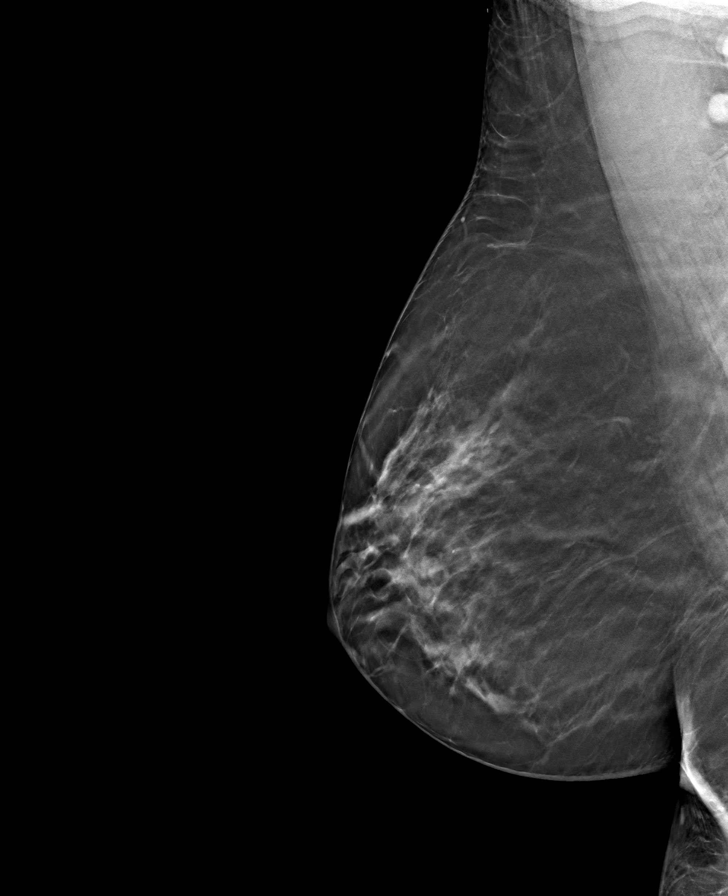

[R CC tomo (2 of 2) · tomo slice 34/67.0]
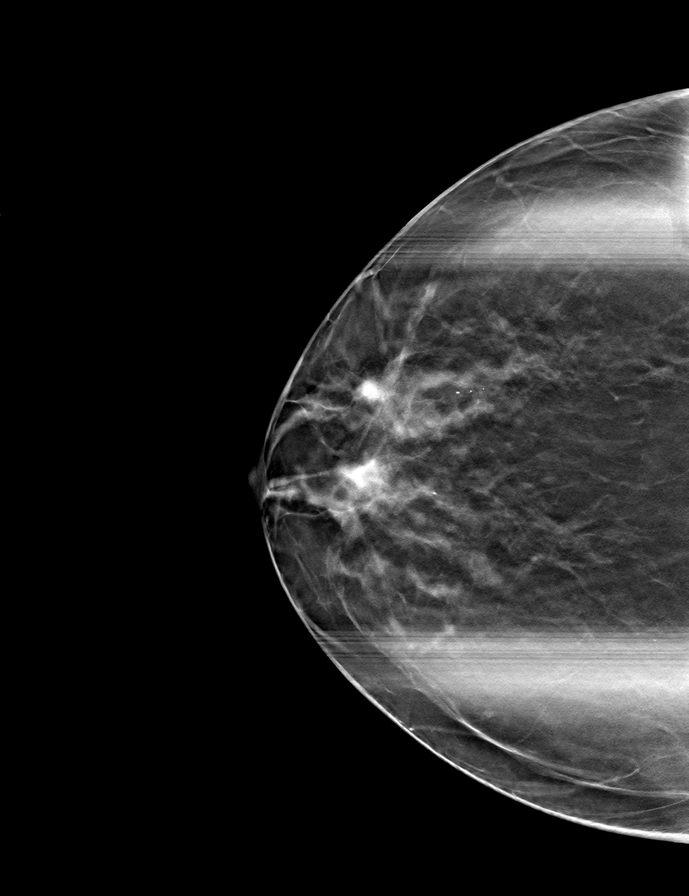

[8 of 20 positions shown; findings below may reference images not displayed]

ACR Breast Density Category b: There are scattered areas of
fibroglandular density.
FINDINGS: CC and MLO tomosynthesis images as well as magnification views of
the right breast were obtained. Questioned asymmetry within the
anterior right breast resolved, compatible with normal
fibroglandular tissue. Loosely grouped round and punctate
calcifications within the upper-outer right breast are demonstrated
on magnification views.

Mammographic images were processed with CAD.
IMPRESSION: Probably benign right breast calcifications.

RECOMMENDATION:
Right breast diagnostic mammography with magnification views in 6
months.

I have discussed the findings and recommendations with the patient.
Results were also provided in writing at the conclusion of the
visit. If applicable, a reminder letter will be sent to the patient
regarding the next appointment.

BI-RADS CATEGORY  3: Probably benign.

## 2019-09-03 ENCOUNTER — Other Ambulatory Visit: Payer: Self-pay | Admitting: Physician Assistant

## 2019-09-03 DIAGNOSIS — R921 Mammographic calcification found on diagnostic imaging of breast: Secondary | ICD-10-CM

## 2019-09-25 ENCOUNTER — Other Ambulatory Visit: Payer: Self-pay

## 2019-09-25 ENCOUNTER — Ambulatory Visit
Admission: RE | Admit: 2019-09-25 | Discharge: 2019-09-25 | Disposition: A | Discharge status: Home / Self Care | Payer: Medicare HMO | Source: Ambulatory Visit | Attending: Physician Assistant | Admitting: Physician Assistant

## 2019-09-25 DIAGNOSIS — R921 Mammographic calcification found on diagnostic imaging of breast: Secondary | ICD-10-CM

## 2020-02-19 ENCOUNTER — Other Ambulatory Visit: Payer: Self-pay | Admitting: Family Medicine

## 2020-02-19 DIAGNOSIS — R5381 Other malaise: Secondary | ICD-10-CM

## 2020-02-26 ENCOUNTER — Other Ambulatory Visit: Payer: Self-pay | Admitting: Family

## 2020-02-26 ENCOUNTER — Other Ambulatory Visit: Payer: Self-pay | Admitting: Family Medicine

## 2020-02-26 DIAGNOSIS — M810 Age-related osteoporosis without current pathological fracture: Secondary | ICD-10-CM

## 2020-06-05 ENCOUNTER — Other Ambulatory Visit: Payer: Self-pay

## 2020-06-05 ENCOUNTER — Ambulatory Visit
Admission: RE | Admit: 2020-06-05 | Discharge: 2020-06-05 | Disposition: A | Discharge status: Home / Self Care | Payer: Medicare HMO | Source: Ambulatory Visit | Attending: Family | Admitting: Family

## 2020-06-05 DIAGNOSIS — M810 Age-related osteoporosis without current pathological fracture: Secondary | ICD-10-CM

## 2020-08-17 ENCOUNTER — Other Ambulatory Visit: Payer: Self-pay | Admitting: Family

## 2020-08-17 DIAGNOSIS — Z1231 Encounter for screening mammogram for malignant neoplasm of breast: Secondary | ICD-10-CM

## 2020-10-09 ENCOUNTER — Ambulatory Visit: Payer: Medicare HMO

## 2020-10-19 ENCOUNTER — Other Ambulatory Visit: Payer: Self-pay

## 2020-10-19 ENCOUNTER — Ambulatory Visit
Admission: RE | Admit: 2020-10-19 | Discharge: 2020-10-19 | Disposition: A | Discharge status: Home / Self Care | Payer: Medicare HMO | Source: Ambulatory Visit | Attending: Family | Admitting: Family

## 2020-10-19 DIAGNOSIS — Z1231 Encounter for screening mammogram for malignant neoplasm of breast: Secondary | ICD-10-CM

## 2021-01-15 ENCOUNTER — Other Ambulatory Visit: Payer: Self-pay | Admitting: Otolaryngology

## 2021-01-15 DIAGNOSIS — D351 Benign neoplasm of parathyroid gland: Secondary | ICD-10-CM

## 2021-02-04 ENCOUNTER — Ambulatory Visit
Admission: RE | Admit: 2021-02-04 | Discharge: 2021-02-04 | Disposition: A | Discharge status: Home / Self Care | Payer: Medicare HMO | Source: Ambulatory Visit | Attending: Otolaryngology | Admitting: Otolaryngology

## 2021-02-04 DIAGNOSIS — D351 Benign neoplasm of parathyroid gland: Secondary | ICD-10-CM

## 2021-03-03 ENCOUNTER — Other Ambulatory Visit: Payer: Self-pay

## 2021-03-03 ENCOUNTER — Encounter: Payer: Self-pay | Admitting: Endocrinology

## 2021-03-03 ENCOUNTER — Ambulatory Visit: Payer: Medicare HMO | Admitting: Endocrinology

## 2021-03-03 VITALS — BP 120/64 | HR 68 | Ht 64.25 in | Wt 160.2 lb

## 2021-03-03 DIAGNOSIS — E213 Hyperparathyroidism, unspecified: Secondary | ICD-10-CM | POA: Diagnosis not present

## 2021-03-03 DIAGNOSIS — M810 Age-related osteoporosis without current pathological fracture: Secondary | ICD-10-CM | POA: Diagnosis not present

## 2021-03-03 DIAGNOSIS — E119 Type 2 diabetes mellitus without complications: Secondary | ICD-10-CM | POA: Diagnosis not present

## 2021-03-03 LAB — BASIC METABOLIC PANEL
BUN: 15 mg/dL (ref 6–23)
CO2: 25 mEq/L (ref 19–32)
Calcium: 10.4 mg/dL (ref 8.4–10.5)
Chloride: 106 mEq/L (ref 96–112)
Creatinine, Ser: 0.68 mg/dL (ref 0.40–1.20)
GFR: 88.05 mL/min (ref >60.00)
Glucose, Bld: 116 mg/dL — ABNORMAL HIGH (ref 70–99)
Potassium: 4.1 mEq/L (ref 3.5–5.1)
Sodium: 138 mEq/L (ref 135–145)

## 2021-03-03 LAB — HEMOGLOBIN A1C: Hgb A1c MFr Bld: 6.8 % — ABNORMAL HIGH (ref 4.6–6.5)

## 2021-03-03 LAB — VITAMIN D 25 HYDROXY (VIT D DEFICIENCY, FRACTURES): VITD: 23.23 ng/mL — ABNORMAL LOW (ref 30.00–100.00)

## 2021-03-03 NOTE — Progress Notes (Addendum)
Patient ID: Jillian Powell, female   DOB: [birth year], 70 y.o.   MRN: [id number]          Chief complaint: Endocrinology follow-up  History of Present Illness:   HYPERCALCEMIA: History indicates that she has had a high calcium since 11/20 16 Previously highest calcium 11.4 However she has not had any recent labs   Lab Results  Component Value Date   CALCIUM 10.4 03/03/2021   CALCIUM 11.0 (H) 03/29/2019   CALCIUM 11.1 (H) 04/24/2018   CALCIUM 11.1 (H) 10/10/2017   CALCIUM 10.9 (H) 07/03/2017   .  She did have kidney stones in 2015 but no recurrence Previously had high 24 urine calcium Another 24 urine calcium checked which was normal but her collection was inadequate with low amount of creatinine in the measurement  Prior serologic and radiologic studies have included:   PTH levels: In 09/2015 = 46 and in 11/17 = 24  01/26/2016: 24 urine calcium = 526.4 (H)   25 (OH) Vitamin D level, last was 29.5 and 1, 25 hydroxy vitamin D was 43.7   Parathyroid scan in done in 03/2016: Mild activity corresponding to 5 x 5 x 3 mm nodular density posterior to the upper pole of the right thyroid lobe. These findings suggest the possibility of small parathyroid adenoma at this site.   THYROID ultrasound from 12/22 shows a 6 mm nodule adjacent to the left thyroid lobe but no nodule on the right side  OSTEOPOROSIS:  She appears to have lost 1-1/2 inches since her youth, previous height about 5 feet 5 inches No history of fracture  BONE density results:    Lumbar spine L1-L4 Femoral neck (FN) 33% distal radius  T-scores 2019 -2.6 RFN: -2.3 LFN: -2.4 n/a  T-scores 06/2020 -2.9 -2.9 LFN Total femur = -2.8 n/a   Previous bone density in 2017 showed spine T score -2.5  Prior treatment has included boniva for 3 years, not clear of the starting and stopping dates, probably stopped in 2017 In 2019 she was given risedronate 150 mg monthly but she stopped this because of high cost   Subsequently was recommended alendronate but she did not take it and has not followed up since 2020  Also not taking any vitamin D    Lab Results  Component Value Date   VD25OH 23.23 (L) 03/03/2021   VD25OH 19.39 (L) 04/24/2018       Allergies as of 03/03/2021       Reactions   Shellfish Allergy Anaphylaxis   Penicillins Hives, Itching   Sulfa Antibiotics Hives, Itching        Medication List        Accurate as of March 03, 2021  3:35 PM. If you have any questions, ask your nurse or doctor.          ASCORBIC ACID PO Take 1 tablet by mouth daily.   Cholecalciferol 25 MCG (1000 UT) tablet Take 1,000 Units by mouth daily.   ibuprofen 200 MG tablet Commonly known as: ADVIL Take 600 mg by mouth 2 (two) times daily as needed for headache or moderate pain.   levocetirizine 5 MG tablet Commonly known as: XYZAL Take 5 mg by mouth daily as needed for allergies.   Lotrel 5-10 MG capsule Generic drug: amLODipine-benazepril Take 1 capsule by mouth daily.   Systane 0.4-0.3 % Soln Generic drug: Polyethyl Glycol-Propyl Glycol Place 1 drop into the right eye daily as needed (Dry eyes).   ZINC PO Take 1 capsule  by mouth daily.        Allergies:  Allergies  Allergen Reactions   Shellfish Allergy Anaphylaxis   Penicillins Hives and Itching   Sulfa Antibiotics Hives and Itching    Past Medical History:  Diagnosis Date   Diabetes mellitus without complication ([facility])    Elevated liver enzymes     Past Surgical History:  Procedure Laterality Date   None      Family History  Problem Relation Age of Onset   Diabetes Mother    CAD Mother    Other Father        old age died at 69   Thyroid disease Neg Hx    Breast cancer Neg Hx    Colon cancer Neg Hx    Liver cancer Neg Hx     Social History:  reports that she has never smoked. She has never used smokeless tobacco. She reports that she does not drink alcohol and does not use drugs.  Review of  Systems  DIABETES:  She was diagnosed to have diabetes with an A1c of 7% in March 2019 She has been reluctant to take metformin Previously  she has seen the dietitian in 2019  Lately she is doing some walking when the weather is good but weight is about the same Not monitoring at home  Lab glucose was 99   Wt Readings from Last 3 Encounters:  03/03/21 160 lb 3.2 oz (72.7 kg)  03/29/19 158 lb (71.7 kg)  04/27/18 161 lb 6.4 oz (73.2 kg)    Lab Results  Component Value Date   HGBA1C 6.8 (H) 03/03/2021   HGBA1C 6.4 04/24/2018   HGBA1C 5.8 (A) 09/26/2017   Lab Results  Component Value Date   CREATININE 0.68 03/03/2021     EXAM:  BP 120/64    Pulse 68    Ht 5' 4.25" (1.632 m)    Wt 160 lb 3.2 oz (72.7 kg)    SpO2 97%    BMI 27.28 kg/m     Assessment/Plan:   HYPERPARATHYROIDISM:  She has mild persistent hypercalcemia since at least 2016 with levels around 11 This was confirmed with inappropriately high PTH at baseline and 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 previously She also has had significant hypercalciuria initially but no kidney stones since 2015  Serum calcium will need to be repeated  OSTEOPOROSIS: T score is -2.9 at the last measurement and showing some decrease This is likely to be from both age related osteoporosis and hyperparathyroidism He does have a history of height loss but no fractures  Options are to proceed with parathyroidectomy or consider Prolia which can be used long-term without side effects Currently Evenity and Tymlos are not covered by her insurance  VITAMIN D deficiency: This will be reevaluated and likely needs at least a small dose for bone health, higher doses may cause hypercalcemia   DIABETES   No records of her A1c are available and will need to be updated She does not monitor at home and is not on medications  Total visit time for evaluation and management of multiple problems and counseling =25  minutes   There are no Patient Instructions on file for this visit.    Jillian Powell 03/03/2021, 3:35 PM   Calcium is normal, since her osteoporosis has not progressed significantly we will give her a trial of Prolia first Also needs vitamin D3 2000 units daily for supplement Emphasized weight loss with diet and exercise with  A1c 6.8  Jillian Powell

## 2021-03-09 ENCOUNTER — Telehealth: Payer: Self-pay | Admitting: Endocrinology

## 2021-03-09 NOTE — Telephone Encounter (Signed)
Discussed with the patient.  She needs to start on Prolia injections every 6 months.  Please proceed with prior authorization

## 2021-03-17 NOTE — Telephone Encounter (Signed)
Prolia VOB initiated via [email or web address] ° °Last OV:  °Next OV:  °Last Prolia inj:  °Next Prolia inj DUE: NEW START ° °

## 2021-03-30 NOTE — Telephone Encounter (Signed)
Prior auth required for PROLIA ? ?PA PROCESS DETAILS: PA is required. PA can be initiated by calling [phone] or online at ?[email or web address] ? ?

## 2021-03-31 NOTE — Telephone Encounter (Signed)
Prior auth initiated via [email or web address] KEY: [id number]

## 2021-04-03 NOTE — Telephone Encounter (Signed)
Deniedon January 25 We cover this drug when our criteria are met. The unmet criteria are: has a history of osteoporotic fracture or has tried or cannot use zoledronic acid (step therapy requirement). This decision was from [employer]&ap[employer];s Prolia (denosumab) Pharmacy Coverage Policy.

## 2021-04-08 ENCOUNTER — Ambulatory Visit: Payer: Medicare HMO | Admitting: Endocrinology

## 2021-04-08 ENCOUNTER — Other Ambulatory Visit: Payer: Self-pay

## 2021-04-08 ENCOUNTER — Encounter: Payer: Self-pay | Admitting: Endocrinology

## 2021-04-08 VITALS — BP 136/70 | HR 76 | Ht 63.75 in | Wt 155.0 lb

## 2021-04-08 DIAGNOSIS — E213 Hyperparathyroidism, unspecified: Secondary | ICD-10-CM

## 2021-04-08 DIAGNOSIS — M810 Age-related osteoporosis without current pathological fracture: Secondary | ICD-10-CM | POA: Diagnosis not present

## 2021-04-08 NOTE — Progress Notes (Signed)
Patient ID: Jillian Powell, female   DOB: 12/18/1950, 71 y.o.   MRN: [id number]          Chief complaint: Endocrinology follow-up  History of Present Illness:   HYPERCALCEMIA: Records show that she has had a high calcium since 01/25/15 Previously highest calcium 11.4 Recent history:  Lab Results  Component Value Date   CALCIUM 10.4 03/03/2021   CALCIUM 11.0 (H) 03/29/2019   CALCIUM 11.1 (H) 04/24/2018   CALCIUM 11.1 (H) 10/10/2017   CALCIUM 10.9 (H) 07/03/2017   .  She did have kidney stones in 2015 but no recurrence Previously had high 24 urine calcium Another 24 urine calcium checked which was normal but her collection was inadequate with low amount of creatinine in the measurement  Prior serologic and radiologic studies have included:   PTH levels: In 09/2015 = 46 and in 11/17 = 24  01/26/2016: 24 urine calcium = 526.4 (H)   25 (OH) Vitamin D level, last was 23.2 and previous 1, 25 hydroxy vitamin D was 43.7   Parathyroid scan in done in 03/2016: Mild activity corresponding to 5 x 5 x 3 mm nodular density posterior to the upper pole of the right thyroid lobe. These findings suggest the possibility of small parathyroid adenoma at this site.   THYROID ultrasound from 12/22 shows a 6 mm nodule adjacent to the left thyroid lobe but no nodule on the right side  OSTEOPOROSIS:  She appears to have lost 1-1/2 inches since her youth, previous height about 5 feet 5 inches No history of fracture  BONE density results:    Lumbar spine L1-L4 Femoral neck (FN) 33% distal radius  T-scores 2019 -2.6 RFN: -2.3 LFN: -2.4 n/a  T-scores 06/2020 -2.9 -2.9 LFN Total femur = -2.8 n/a   Previous bone density in 2017 showed spine T score -2.5  Prior treatment has included boniva for 3 years, not clear of the starting and stopping dates, probably stopped in 2017 In 2019 she was given risedronate 150 mg monthly but she stopped this because of high cost  Subsequently was recommended  alendronate but she did not take it   She is concerned about side effects of taking Reclast which was recommended since insurance will not approve Prolia.  This is because of step therapy requirement  Recently has been taking 2000 units of vitamin D3 since her last level was low   Lab Results  Component Value Date   VD25OH 23.23 (L) 03/03/2021   VD25OH 19.39 (L) 04/24/2018       Allergies as of 04/08/2021       Reactions   Shellfish Allergy Anaphylaxis   Penicillins Hives, Itching   Sulfa Antibiotics Hives, Itching        Medication List        Accurate as of April 08, 2021  4:46 PM. If you have any questions, ask your nurse or doctor.          ASCORBIC ACID PO Take 1 tablet by mouth daily.   Cholecalciferol 25 MCG (1000 UT) tablet Take 1,000 Units by mouth daily.   ibuprofen 200 MG tablet Commonly known as: ADVIL Take 600 mg by mouth 2 (two) times daily as needed for headache or moderate pain.   levocetirizine 5 MG tablet Commonly known as: XYZAL Take 5 mg by mouth daily as needed for allergies.   Lotrel 5-10 MG capsule Generic drug: amLODipine-benazepril Take 1 capsule by mouth daily.   Systane 0.4-0.3 % Soln Generic drug:  Polyethyl Glycol-Propyl Glycol Place 1 drop into the right eye daily as needed (Dry eyes).   ZINC PO Take 1 capsule by mouth daily.        Allergies:  Allergies  Allergen Reactions   Shellfish Allergy Anaphylaxis   Penicillins Hives and Itching   Sulfa Antibiotics Hives and Itching    Past Medical History:  Diagnosis Date   Diabetes mellitus without complication (HCC)    Elevated liver enzymes     Past Surgical History:  Procedure Laterality Date   None      Family History  Problem Relation Age of Onset   Diabetes Mother    CAD Mother    Other Father        old age died at 29   Thyroid disease Neg Hx    Breast cancer Neg Hx    Colon cancer Neg Hx    Liver cancer Neg Hx     Social History:  reports  that she has never smoked. She has never used smokeless tobacco. She reports that she does not drink alcohol and does not use drugs.  Review of Systems  DIABETES:  She was diagnosed to have diabetes with an A1c of 7% in March 2019 She has been reluctant to take metformin Previously  she has seen the dietitian in 2019  Lately she is doing some walking when the weather is good but weight is about the same Not monitoring at home  Lab glucose was 99   Wt Readings from Last 3 Encounters:  04/08/21 155 lb (70.3 kg)  03/03/21 160 lb 3.2 oz (72.7 kg)  03/29/19 158 lb (71.7 kg)    Lab Results  Component Value Date   HGBA1C 6.8 (H) 03/03/2021   HGBA1C 6.4 04/24/2018   HGBA1C 5.8 (A) 09/26/2017   Lab Results  Component Value Date   CREATININE 0.68 03/03/2021     EXAM:  BP 136/70    Pulse 76    Ht 5' 3.75" (1.619 m)    Wt 155 lb (70.3 kg)    SpO2 98%    BMI 26.81 kg/m     Assessment/Plan:   HYPERPARATHYROIDISM:  She has mild persistent hypercalcemia since at least 2016 with levels around 11 This was confirmed with inappropriately high PTH at baseline and 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 previously She also has had significant hypercalciuria initially but no kidney stones since 2015 Recent calcium was normal  OSTEOPOROSIS: T score is -2.9 at the last measurement and showing decrease This is likely to be from both age related osteoporosis and hyperparathyroidism He does have a history of height loss but no fractures  Discussed benefits and side effects of Reclast Since she has had treatment with Boniva without any side effects previously should do well with this and she agrees to do this after discussion today Currently Evenity, Prolia and Tymlos are not covered by her insurance  Also discussed possibility of parathyroid surgery if she does not want to go with osteoporosis treatment, however will need repeat parathyroid scan for  this  VITAMIN D deficiency: She will continue 2000 units of vitamin D3     There are no Patient Instructions on file for this visit.    Jillian Powell 04/08/2021, 4:46 PM

## 2021-05-05 NOTE — Telephone Encounter (Signed)
Pt archived in [email or web address].  Please advise if patient and/or provider wish to proceed with Prolia therpay.  

## 2021-05-07 NOTE — Telephone Encounter (Signed)
Received paperwork from her insurance company that [place] was approved from 03/31/21-03/06/22. Need to know if she has a copay? ?

## 2021-05-13 NOTE — Telephone Encounter (Signed)
Pt ready for scheduling on or after 05/13/21  Out-of-pocket cost due at time of visit: $301  Primary: [employer] Prolia co-insurance: 20% (approximately $276) Admin fee co-insurance: 20% (approximately $25)  Secondary: n/a Prolia co-insurance:  Admin fee co-insurance:   Deductible: does not apply  Prior Auth:  PA# Valid: 03/31/21-03/06/22  ** This summary of benefits is an estimation of the patient's out-of-pocket cost. Exact cost may vary based on individual plan coverage.

## 2021-05-14 NOTE — Telephone Encounter (Signed)
All I have is a Member [id number] and Reference [id number] from [facility] approval letter. Also, patient reached out and stated [employer] told her the out of pocket cost was $95. Please advise ?

## 2021-05-19 NOTE — Telephone Encounter (Signed)
Called patient and informed her of previous note. Patient verbalized that she wanted to go ahead and schedule Prolia. I asked if she wanted to call [employer] again just to verify that she only has to pay $95. She stated no. She will pay $95 and verbalized that if she gets statement for more she will pay. She is aware of the estimated amount that was given was $301 but insist that [facility] told her $95. Transferred to [employer]. To schedule ?

## 2021-05-28 ENCOUNTER — Other Ambulatory Visit (INDEPENDENT_AMBULATORY_CARE_PROVIDER_SITE_OTHER): Payer: Medicare HMO

## 2021-05-28 ENCOUNTER — Other Ambulatory Visit: Payer: Medicare HMO

## 2021-05-28 ENCOUNTER — Ambulatory Visit: Payer: Medicare HMO

## 2021-05-28 ENCOUNTER — Other Ambulatory Visit: Payer: Self-pay

## 2021-05-28 DIAGNOSIS — E213 Hyperparathyroidism, unspecified: Secondary | ICD-10-CM | POA: Diagnosis not present

## 2021-05-28 DIAGNOSIS — M810 Age-related osteoporosis without current pathological fracture: Secondary | ICD-10-CM | POA: Diagnosis not present

## 2021-05-28 LAB — BASIC METABOLIC PANEL
BUN: 15 mg/dL (ref 6–23)
CO2: 29 mEq/L (ref 19–32)
Calcium: 10.8 mg/dL — ABNORMAL HIGH (ref 8.4–10.5)
Chloride: 105 mEq/L (ref 96–112)
Creatinine, Ser: 0.72 mg/dL (ref 0.40–1.20)
GFR: 84.39 mL/min (ref >60.00)
Glucose, Bld: 149 mg/dL — ABNORMAL HIGH (ref 70–99)
Potassium: 4.5 mEq/L (ref 3.5–5.1)
Sodium: 140 mEq/L (ref 135–145)

## 2021-05-28 MED ORDER — DENOSUMAB 60 MG/ML ~~LOC~~ SOSY
60.0000 mg | PREFILLED_SYRINGE | Freq: Once | SUBCUTANEOUS | Status: AC
  Administered 2021-05-28: 60 mg via SUBCUTANEOUS

## 2021-05-28 NOTE — Telephone Encounter (Signed)
Last Prolia inj 05/28/21 ?Next Prolia inj 11/29/21 ?

## 2021-05-28 NOTE — Progress Notes (Signed)
Patient verbally confirmed name, date of birth, and correct medication to be administered. Prolia Injection administered in left arm patient wanted 15 minutes after injection and  pt tolerated well.  ?

## 2021-05-29 LAB — PARATHYROID HORMONE, INTACT (NO CA): PTH: 21 pg/mL (ref 15–65)

## 2021-06-01 ENCOUNTER — Ambulatory Visit: Payer: Medicare HMO | Admitting: Endocrinology

## 2021-06-01 LAB — VITAMIN D 25 HYDROXY (VIT D DEFICIENCY, FRACTURES): VITD: 33.78 ng/mL (ref 30.00–100.00)

## 2021-06-17 NOTE — Progress Notes (Signed)
Patient ID: Jillian Powell, female   DOB: 1951-03-04, 71 y.o.   MRN: [id number] ?       ? ? ?Chief complaint: Endocrinology follow-up ? ?History of Present Illness: ?  ?HYPERCALCEMIA/hyperparathyroidism: ? ?Apparently she has had a high calcium since 01/25/15 ?Previously highest calcium 11.4 ?Recent history: ? ?Lab Results  ?Component Value Date  ? CALCIUM 10.8 (H) 05/28/2021  ? CALCIUM 10.4 03/03/2021  ? CALCIUM 11.0 (H) 03/29/2019  ? CALCIUM 11.1 (H) 04/24/2018  ? CALCIUM 11.1 (H) 10/10/2017  ? CALCIUM 10.9 (H) 07/03/2017  ? ?.  ?She did have kidney stones in 2015 but no recurrence ?Previously had high 24 urine calcium ? ?PTH is now normal at 21 ? ?Prior serologic and radiologic studies have included:  ? ?PTH levels: In 09/2015 = 46 and in 11/17 = 24 ? ?01/26/2016: 24 urine calcium = 526.4 (H)  ? ?25 (OH) Vitamin D level, last was 23.2 and previous 1, 25 hydroxy vitamin D was 43.7  ? ?Parathyroid scan in done in 03/2016: Mild activity corresponding to 5 x 5 x 3 mm nodular density posterior to the upper pole of the right thyroid lobe. These findings suggest the possibility of small parathyroid adenoma at this site.  ? ?THYROID ultrasound from 12/22 shows a 6 mm nodule adjacent to the left thyroid lobe but no nodule on the right side ? ?OSTEOPOROSIS: ? ?She has lost 1-1/2 inches since her youth, previous height about 5 feet 5 inches ?No history of fracture ? ?BONE density results: ? ?  Lumbar spine L1-L4 Femoral neck (FN) 33% distal radius  ?T-scores 2019 -2.6 RFN: -2.3 ?LFN: -2.4 n/a  ?T-scores 06/2020 -2.9 -2.9 LFN ?Total femur = -2.8 n/a  ? ?Previous bone density in 2017 showed spine T score -2.5 ? ?Prior treatment has included boniva for 3 years, not clear of the starting and stopping dates, probably stopped in 2017 ?In 2019 she was given risedronate 150 mg monthly but she stopped this because of high cost  ?Subsequently was recommended alendronate but she did not take it  ? ?She did not want to take Reclast and  is starting Prolia now after prior authorization, first injection on 05/28/2021 ?After a week she noticed feeling a little soreness in her upper legs at times but not interfering with her walking ? ?Also has been taking 2000 units of vitamin D3 since her baseline level was low ?Follow-up normal ? ?Lab Results  ?Component Value Date  ? VD25OH 33.78 05/28/2021  ? VD25OH 23.23 (L) 03/03/2021  ? ? ? ? ? ?Allergies as of 06/18/2021   ? ?   Reactions  ? Shellfish Allergy Anaphylaxis  ? Penicillins Hives, Itching  ? Sulfa Antibiotics Hives, Itching  ? ?  ? ?  ?Medication List  ?  ? ?  ? Accurate as of June 18, 2021  8:26 AM. If you have any questions, ask your nurse or doctor.  ?  ?  ? ?  ? ?ASCORBIC ACID PO ?Take 1 tablet by mouth daily. ?  ?Cholecalciferol 25 MCG (1000 UT) tablet ?Take 1,000 Units by mouth daily. ?  ?ibuprofen 200 MG tablet ?Commonly known as: ADVIL ?Take 600 mg by mouth 2 (two) times daily as needed for headache or moderate pain. ?  ?levocetirizine 5 MG tablet ?Commonly known as: XYZAL ?Take 5 mg by mouth daily as needed for allergies. ?  ?Lotrel 5-10 MG capsule ?Generic drug: amLODipine-benazepril ?Take 1 capsule by mouth daily. ?  ?Systane 0.4-0.3 %  Soln ?Generic drug: Polyethyl Glycol-Propyl Glycol ?Place 1 drop into the right eye daily as needed (Dry eyes). ?  ?ZINC PO ?Take 1 capsule by mouth daily. ?  ? ?  ? ? ?Allergies:  ?Allergies  ?Allergen Reactions  ? Shellfish Allergy Anaphylaxis  ? Penicillins Hives and Itching  ? Sulfa Antibiotics Hives and Itching  ? ? ?Past Medical History:  ?Diagnosis Date  ? Diabetes mellitus without complication ([place])   ? Elevated liver enzymes   ? ? ?Past Surgical History:  ?Procedure Laterality Date  ? None    ? ? ?Family History  ?Problem Relation Age of Onset  ? Diabetes Mother   ? CAD Mother   ? Other Father   ?     old age died at 55  ? Thyroid disease Neg Hx   ? Breast cancer Neg Hx   ? Colon cancer Neg Hx   ? Liver cancer Neg Hx   ? ? ?Social History:   reports that she has never smoked. She has never used smokeless tobacco. She reports that she does not drink alcohol and does not use drugs. ? ?Review of Systems ? ?DIABETES: ? ?She was diagnosed to have diabetes with an A1c of 7% in March 2019 ?She has been reluctant to take metformin ?Previously  she has seen the dietitian in 2019 ? ?Lately she is doing some walking when the weather is good but weight is about the same ?Not monitoring at home ? ?Lab glucose was 99 ? ? ?Wt Readings from Last 3 Encounters:  ?06/18/21 161 lb 6.4 oz (73.2 kg)  ?04/08/21 155 lb (70.3 kg)  ?03/03/21 160 lb 3.2 oz (72.7 kg)  ? ? ?Lab Results  ?Component Value Date  ? HGBA1C 6.8 (H) 03/03/2021  ? HGBA1C 6.4 04/24/2018  ? HGBA1C 5.8 (A) 09/26/2017  ? ?Lab Results  ?Component Value Date  ? CREATININE 0.72 05/28/2021  ? ? ? ?EXAM: ? ?BP 136/84 (BP Location: Left Arm, Patient Position: Sitting, Cuff Size: Normal)   Pulse 78   Ht 5' 3.75" (1.619 m)   Wt 161 lb 6.4 oz (73.2 kg)   SpO2 96%   BMI 27.92 kg/m?  ? ? ? ?Assessment/Plan:  ? ?HYPERPARATHYROIDISM: ? 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 ?Although her recent PTH is more normal she is still likely has hyperparathyroidism, last calcium 10.8 ?No indication for surgery as yet as she should be able to adequately manage her osteoporosis with treatment, follow-up bone density in about 18 months ? ?OSTEOPOROSIS: T score is -2.9 at the last measurement ?Also has history of height loss ?Now starting Prolia ?Although she has mild bone pain after the injection in her upper legs this is mild ?She will come back in 9/23 for follow-up  injection ? ?VITAMIN D deficiency: Adequately replaced now, she will continue 2000 units of vitamin D3 ? ? ? ? ?There are no Patient Instructions on file for this visit. ? ? ? ?Elayne Snare ?06/18/2021, 8:26 AM  ? ? ? ?

## 2021-06-18 ENCOUNTER — Ambulatory Visit: Payer: Medicare HMO | Admitting: Endocrinology

## 2021-06-18 VITALS — BP 136/84 | HR 78 | Ht 63.75 in | Wt 161.4 lb

## 2021-06-18 DIAGNOSIS — R82994 Hypercalciuria: Secondary | ICD-10-CM

## 2021-06-18 DIAGNOSIS — M810 Age-related osteoporosis without current pathological fracture: Secondary | ICD-10-CM

## 2021-06-18 DIAGNOSIS — E213 Hyperparathyroidism, unspecified: Secondary | ICD-10-CM

## 2021-06-22 ENCOUNTER — Other Ambulatory Visit (INDEPENDENT_AMBULATORY_CARE_PROVIDER_SITE_OTHER): Payer: Medicare HMO

## 2021-06-22 DIAGNOSIS — E213 Hyperparathyroidism, unspecified: Secondary | ICD-10-CM | POA: Diagnosis not present

## 2021-06-22 DIAGNOSIS — R82994 Hypercalciuria: Secondary | ICD-10-CM

## 2021-06-22 NOTE — Progress Notes (Unsigned)
Total volume 1,750.  Started 06-21-21 at 7:00 am.,  ended 06-22-21 at 7:00 am. ?

## 2021-06-24 LAB — CREATININE, URINE, 24 HOUR
Creatinine, 24H Ur: 977 mg/24 hr (ref 800–1800)
Creatinine, Urine: 55.8 mg/dL

## 2021-06-24 LAB — CALCIUM, URINE, 24 HOUR
Calcium, 24H Urine: 375 mg/24 hr — ABNORMAL HIGH (ref 0–320)
Calcium, Urine: 21.4 mg/dL

## 2021-07-14 ENCOUNTER — Telehealth: Payer: Self-pay

## 2021-07-14 DIAGNOSIS — R82994 Hypercalciuria: Secondary | ICD-10-CM

## 2021-07-14 MED ORDER — HYDROCHLOROTHIAZIDE 12.5 MG PO TABS: 12.5000 mg | ORAL_TABLET | Freq: Every day | ORAL | 3 refills | Status: DC

## 2021-07-14 NOTE — Telephone Encounter (Signed)
Patient called in for results on 24 hour urine collection. ?

## 2021-07-14 NOTE — Telephone Encounter (Signed)
Patient notified. Rx sent in she will call back to make appt. ?

## 2021-07-30 NOTE — Telephone Encounter (Signed)
Patient called in states that she is on a blood pressure medication already Lotrel and feels she should not be on hydrochlorothiazide. Informed patient she is not on this medication for this reason, but still has concerns.

## 2021-08-03 ENCOUNTER — Other Ambulatory Visit: Payer: Self-pay | Admitting: Endocrinology

## 2021-08-03 DIAGNOSIS — R82994 Hypercalciuria: Secondary | ICD-10-CM

## 2021-09-13 ENCOUNTER — Other Ambulatory Visit: Payer: Self-pay | Admitting: Family Medicine

## 2021-09-13 DIAGNOSIS — Z1231 Encounter for screening mammogram for malignant neoplasm of breast: Secondary | ICD-10-CM

## 2021-09-23 ENCOUNTER — Other Ambulatory Visit (INDEPENDENT_AMBULATORY_CARE_PROVIDER_SITE_OTHER): Payer: Medicare HMO

## 2021-09-23 DIAGNOSIS — E213 Hyperparathyroidism, unspecified: Secondary | ICD-10-CM | POA: Diagnosis not present

## 2021-09-23 LAB — BASIC METABOLIC PANEL
BUN: 18 mg/dL (ref 6–23)
CO2: 26 mEq/L (ref 19–32)
Calcium: 10.6 mg/dL — ABNORMAL HIGH (ref 8.4–10.5)
Chloride: 104 mEq/L (ref 96–112)
Creatinine, Ser: 1.01 mg/dL (ref 0.40–1.20)
GFR: 56.09 mL/min — ABNORMAL LOW (ref >60.00)
Glucose, Bld: 173 mg/dL — ABNORMAL HIGH (ref 70–99)
Potassium: 4.2 mEq/L (ref 3.5–5.1)
Sodium: 136 mEq/L (ref 135–145)

## 2021-09-28 ENCOUNTER — Encounter: Payer: Self-pay | Admitting: Endocrinology

## 2021-09-28 ENCOUNTER — Ambulatory Visit: Payer: Medicare HMO | Admitting: Endocrinology

## 2021-09-28 VITALS — BP 142/78 | HR 68 | Ht 64.0 in | Wt 161.2 lb

## 2021-09-28 DIAGNOSIS — E119 Type 2 diabetes mellitus without complications: Secondary | ICD-10-CM | POA: Diagnosis not present

## 2021-09-28 DIAGNOSIS — M810 Age-related osteoporosis without current pathological fracture: Secondary | ICD-10-CM

## 2021-09-28 DIAGNOSIS — E213 Hyperparathyroidism, unspecified: Secondary | ICD-10-CM | POA: Diagnosis not present

## 2021-09-28 DIAGNOSIS — R82994 Hypercalciuria: Secondary | ICD-10-CM | POA: Diagnosis not present

## 2021-09-28 NOTE — Progress Notes (Signed)
Patient ID: Jillian Powell, female   DOB: [birth year], 71 y.o.   MRN: [id number]          Chief complaint: Endocrinology follow-up  History of Present Illness:   HYPERCALCEMIA/hyperparathyroidism:  Apparently she has had a high calcium since 01/25/15 Previously highest calcium 11.4 Highest PTH level previously 46  For her hypercalciuria she was supposed to start on HCTZ 12.5 mg daily after her last visit but she says her blood pressure got low and she stopped it, blood pressure was getting higher with HCTZ alone and is back on Lotrel  Recent history:  Lab Results  Component Value Date   CALCIUM 10.6 (H) 09/23/2021   CALCIUM 10.8 (H) 05/28/2021   CALCIUM 10.4 03/03/2021   CALCIUM 11.0 (H) 03/29/2019   CALCIUM 11.1 (H) 04/24/2018   CALCIUM 11.1 (H) 10/10/2017   CALCIUM 10.9 (H) 07/03/2017   .  She did have kidney stones in 2015 but no recurrence She has had high 24 urine calcium  PTH is normal at 21  Prior serologic and radiologic studies have included:   PTH levels: In 09/2015 = 46 and in 11/17 = 24  01/26/2016: 24 urine calcium = 526.4 (H)   25 (OH) Vitamin D level, last was 23.2 and previous 1, 25 hydroxy vitamin D was 43.7   Parathyroid scan in done in 03/2016: Mild activity corresponding to 5 x 5 x 3 mm nodular density posterior to the upper pole of the right thyroid lobe. These findings suggest the possibility of small parathyroid adenoma at this site.   THYROID ultrasound from 12/22 shows a 6 mm nodule adjacent to the left thyroid lobe but no nodule on the right side  OSTEOPOROSIS:  She has lost 1-1/2 inches since her youth, previous height about 5 feet 5 inches No history of fracture Height is stable today  BONE density results:    Lumbar spine L1-L4 Femoral neck (FN) 33% distal radius  T-scores 2019 -2.6 RFN: -2.3 LFN: -2.4 n/a  T-scores 06/2020 -2.9 -2.9 LFN Total femur = -2.8 n/a   Previous bone density in 2017 showed spine T score -2.5  Prior  treatment has included boniva for 3 years, not clear of the starting and stopping dates, probably stopped in 2017 In 2019 she was given risedronate 150 mg monthly but she stopped this because of high cost  Subsequently was recommended alendronate but she did not take it   She did not want to take Reclast and was able to start Prolia after prior authorization, first injection on 05/28/2021 After a week she noticed feeling a little soreness in her upper legs at times but this was short lasting In the last 2 weeks she thinks she has some pain in her fingers and wrist area and right hip, she thinks her forearm bones tend to throb at night  Also has been taking 2000 units of vitamin D3 since her baseline level was low Follow-up normal  Lab Results  Component Value Date   VD25OH 33.78 05/28/2021   VD25OH 23.23 (L) 03/03/2021       Allergies as of 09/28/2021       Reactions   Shellfish Allergy Anaphylaxis   Penicillins Hives, Itching   Sulfa Antibiotics Hives, Itching        Medication List        Accurate as of September 28, 2021  9:57 AM. If you have any questions, ask your nurse or doctor.  ASCORBIC ACID PO Take 1 tablet by mouth daily.   Cholecalciferol 25 MCG (1000 UT) tablet Take 1,000 Units by mouth daily.   hydrochlorothiazide 12.5 MG tablet Commonly known as: HYDRODIURIL Take 1 tablet (12.5 mg total) by mouth daily.   ibuprofen 200 MG tablet Commonly known as: ADVIL Take 600 mg by mouth 2 (two) times daily as needed for headache or moderate pain.   levocetirizine 5 MG tablet Commonly known as: XYZAL Take 5 mg by mouth daily as needed for allergies.   Lotrel 5-10 MG capsule Generic drug: amLODipine-benazepril Take 1 capsule by mouth daily.   Systane 0.4-0.3 % Soln Generic drug: Polyethyl Glycol-Propyl Glycol Place 1 drop into the right eye daily as needed (Dry eyes).   ZINC PO Take 1 capsule by mouth daily.        Allergies:  Allergies   Allergen Reactions   Shellfish Allergy Anaphylaxis   Penicillins Hives and Itching   Sulfa Antibiotics Hives and Itching    Past Medical History:  Diagnosis Date   Diabetes mellitus without complication (HCC)    Elevated liver enzymes     Past Surgical History:  Procedure Laterality Date   None      Family History  Problem Relation Age of Onset   Diabetes Mother    CAD Mother    Other Father        old age died at 30   Thyroid disease Neg Hx    Breast cancer Neg Hx    Colon cancer Neg Hx    Liver cancer Neg Hx     Social History:  reports that she has never smoked. She has never used smokeless tobacco. She reports that she does not drink alcohol and does not use drugs.  Review of Systems  DIABETES:  She was diagnosed to have diabetes with an A1c of 7% in March 2019 She has been reluctant to take metformin Previously  she has seen the dietitian in 2019  Recently not able to walk or do any exercise for the last month She states she has been getting into more sweets like cookies Random glucose 173 Recent weight about the same Not monitoring at home  Lab glucose was 99   Wt Readings from Last 3 Encounters:  09/28/21 161 lb 3.2 oz (73.1 kg)  06/18/21 161 lb 6.4 oz (73.2 kg)  04/08/21 155 lb (70.3 kg)    Lab Results  Component Value Date   HGBA1C 6.8 (H) 03/03/2021   HGBA1C 6.4 04/24/2018   HGBA1C 5.8 (A) 09/26/2017   Lab Results  Component Value Date   CREATININE 1.01 09/23/2021     EXAM:  BP (!) 142/78   Pulse 68   Ht '5\' 4"'$  (1.626 m)   Wt 161 lb 3.2 oz (73.1 kg)   SpO2 97%   BMI 27.67 kg/m     Assessment/Plan:   HYPERPARATHYROIDISM:  She has mild persistent hypercalcemia since at least 2016 with levels around 11 This was confirmed with inappropriately high PTH at baseline and 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 previously She also has had significant hypercalciuria initially but no kidney stones since  2015 We will repeat her urine calcium especially with starting vitamin D and Prolia  Although her recent PTH is more normal she is still likely has hyperparathyroidism, last calcium 10.8 No indication for surgery as yet as she should be able to adequately manage her osteoporosis with treatment, follow-up bone density in about 18 months  Hypercalciuria: Will recheck urine calcium to see if this is improved with starting Prolia  OSTEOPOROSIS: T score is -2.9 at the last measurement in 06/2020  Has had Prolia in 3/23 Although she has some joint and bone pain in the last 2 weeks unlikely that this is related to Prolia which was given 4 months ago   She will come back in 9/23 for follow-up injection Recheck bone density in late 2024  VITAMIN D deficiency: Adequately replaced  DIABETES: Although not clear if she has had an A1c with her PCP her postprandial glucose is 173 She is does need to start watching her diet and cutting back on carbs and sweets and get on a regular walking program again Advised her to follow-up with her PCP and if needed endocrinology consultation can be done  There are no Patient Instructions on file for this visit.    Elayne Snare 09/28/2021, 9:57 AM

## 2021-10-05 NOTE — Telephone Encounter (Signed)
Prolia VOB initiated via [email or web address]  Last Prolia inj 05/28/21 Next Prolia inj 11/29/21  Prior Auth: APPROVED PA# [id number]  Valid: 03/31/21-03/06/22

## 2021-10-13 ENCOUNTER — Other Ambulatory Visit: Payer: Medicare HMO

## 2021-10-13 DIAGNOSIS — R82994 Hypercalciuria: Secondary | ICD-10-CM

## 2021-10-13 NOTE — Progress Notes (Unsigned)
Total volume 1,350.  Started 10-04-2021 at 7:00 p.m. ended 10-05-2021  at 7:00 p.m.

## 2021-10-14 NOTE — Telephone Encounter (Signed)
Prior auth required for PROLIA ? ?PA PROCESS DETAILS: PA is required. PA can be initiated by calling [phone] or online at ?[email or web address] ? ?

## 2021-10-14 NOTE — Telephone Encounter (Signed)
Pt ready for scheduling on or after 11/29/21   Out-of-pocket cost due at time of visit: $276   Primary: [employer] Prolia co-insurance: 20% (approximately $276) Admin fee co-insurance: 20% (approximately $25)   Secondary: n/a Prolia co-insurance:  Admin fee co-insurance:    Deductible: does not apply   Prior Auth: APPROVED PA# [id number]  Valid: 03/31/21-03/06/22   ** This summary of benefits is an estimation of the patient's out-of-pocket cost. Exact cost may vary based on individual plan coverage.

## 2021-10-19 LAB — CALCIUM, URINE, 24 HOUR
Calcium, 24H Urine: 459 mg/24 hr — ABNORMAL HIGH (ref 0–320)
Calcium, Urine: 34 mg/dL

## 2021-10-19 LAB — CREATININE, URINE, 24 HOUR
Creatinine, 24H Ur: 907 mg/24 hr (ref 800–1800)
Creatinine, Urine: 67.2 mg/dL

## 2021-10-20 ENCOUNTER — Ambulatory Visit: Payer: Medicare HMO

## 2021-10-25 NOTE — Progress Notes (Signed)
Urine calcium is higher, would recommend that she try the HCTZ again along with lisinopril 10 mg daily to balance out the blood pressure.  Otherwise may have to consider parathyroid surgery.  I can prescribe this or her PCP can

## 2021-10-29 ENCOUNTER — Ambulatory Visit
Admission: RE | Admit: 2021-10-29 | Discharge: 2021-10-29 | Disposition: A | Discharge status: Home / Self Care | Payer: Medicare HMO | Source: Ambulatory Visit | Attending: Family Medicine | Admitting: Family Medicine

## 2021-10-29 DIAGNOSIS — Z1231 Encounter for screening mammogram for malignant neoplasm of breast: Secondary | ICD-10-CM

## 2021-12-01 ENCOUNTER — Ambulatory Visit: Payer: Medicare HMO

## 2021-12-01 DIAGNOSIS — M810 Age-related osteoporosis without current pathological fracture: Secondary | ICD-10-CM

## 2021-12-01 MED ORDER — DENOSUMAB 60 MG/ML ~~LOC~~ SOSY
60.0000 mg | PREFILLED_SYRINGE | Freq: Once | SUBCUTANEOUS | Status: AC
  Administered 2021-12-01: 60 mg via SUBCUTANEOUS

## 2021-12-01 NOTE — Progress Notes (Signed)
Patient seen today for Prolia injection.  60mg/ml given in left arm subQ.  Patient tolerated well.  Follow up 6 months or as advised by provider.Patient verified name, DOB and provided verbal consent prior to administration. 

## 2021-12-07 ENCOUNTER — Telehealth: Payer: Self-pay

## 2021-12-07 NOTE — Telephone Encounter (Signed)
Per lab results back on 10/27/21 patient may need to consider parathyroid surgery if not wanting to go on blood pressure medication. Patient would like to do surgery. Please let me know when referral is put in so I can notify patient.

## 2021-12-16 NOTE — Telephone Encounter (Signed)
Last Prolia inj 12/01/21 Next Prolia inj due 06/01/21

## 2021-12-20 ENCOUNTER — Other Ambulatory Visit: Payer: Self-pay | Admitting: Endocrinology

## 2021-12-20 DIAGNOSIS — E213 Hyperparathyroidism, unspecified: Secondary | ICD-10-CM

## 2021-12-20 NOTE — Telephone Encounter (Signed)
Patient notified

## 2022-01-25 ENCOUNTER — Other Ambulatory Visit: Payer: Self-pay | Admitting: Geriatric Medicine

## 2022-01-25 ENCOUNTER — Ambulatory Visit
Admission: RE | Admit: 2022-01-25 | Discharge: 2022-01-25 | Disposition: A | Discharge status: Home / Self Care | Payer: Medicare HMO | Source: Ambulatory Visit | Attending: Geriatric Medicine | Admitting: Geriatric Medicine

## 2022-01-25 DIAGNOSIS — M79672 Pain in left foot: Secondary | ICD-10-CM

## 2022-02-04 ENCOUNTER — Other Ambulatory Visit (HOSPITAL_COMMUNITY): Payer: Self-pay | Admitting: Surgery

## 2022-02-04 DIAGNOSIS — E210 Primary hyperparathyroidism: Secondary | ICD-10-CM

## 2022-02-24 ENCOUNTER — Encounter (HOSPITAL_COMMUNITY)
Admission: RE | Admit: 2022-02-24 | Discharge: 2022-02-24 | Disposition: A | Discharge status: Home / Self Care | Payer: Medicare HMO | Source: Ambulatory Visit | Attending: Surgery | Admitting: Surgery

## 2022-02-24 DIAGNOSIS — E210 Primary hyperparathyroidism: Secondary | ICD-10-CM | POA: Diagnosis present

## 2022-02-24 MED ORDER — TECHNETIUM TC 99M SESTAMIBI - CARDIOLITE: 24.8000 | Freq: Once | INTRAVENOUS | Status: DC | PRN

## 2022-03-09 NOTE — Progress Notes (Signed)
USN from 2022 suggested a left inferior parathyroid adenoma.  However, sestamibi is negative.  Will proceed with a 4D-CT scan of the neck with parathyroid protocol.  Claiborne Billings - please notify patient and arrange study.  Armandina Gemma, MD [facility] [place] practice Office: [phone]

## 2022-03-09 NOTE — Progress Notes (Signed)
Sorry - patient apparently has a contrast allergy.  Will order MRI scan of neck, with and without IV MRI contrast, to evaluate for possible left inferior parathyroid adenoma.  Claiborne Billings - please notify patient and arrange study.  [place], MD [facility] [place] practice Office: [phone]

## 2022-03-11 ENCOUNTER — Other Ambulatory Visit: Payer: Self-pay | Admitting: Surgery

## 2022-03-11 DIAGNOSIS — E210 Primary hyperparathyroidism: Secondary | ICD-10-CM

## 2022-04-08 NOTE — Telephone Encounter (Signed)
Prolia VOB initiated via [email or web address]  Last Prolia inj 12/01/21 Next Prolia inj due 06/01/21

## 2022-04-11 ENCOUNTER — Ambulatory Visit
Admission: RE | Admit: 2022-04-11 | Discharge: 2022-04-11 | Disposition: A | Discharge status: Home / Self Care | Payer: Medicare HMO | Source: Ambulatory Visit | Attending: Surgery | Admitting: Surgery

## 2022-04-11 DIAGNOSIS — E210 Primary hyperparathyroidism: Secondary | ICD-10-CM

## 2022-04-11 MED ORDER — GADOPICLENOL 0.5 MMOL/ML IV SOLN
7.0000 mL | Freq: Once | INTRAVENOUS | Status: AC | PRN
  Administered 2022-04-11: 7 mL via INTRAVENOUS

## 2022-04-13 NOTE — Progress Notes (Signed)
MRI does NOT show a parathyroid adenoma.  USN suggested a left inferior gland.  Claiborne Billings - let's get repeat calcium, PTH levels at [facility] and then I'll see the patient in the office to review results and discuss possible surgery.  Please arrange.  Say[place], MD [facility] [place] practice Office: [phone]

## 2022-05-02 NOTE — Telephone Encounter (Signed)
Prior auth renewal required for [employer]  PA PROCESS DETAILS: PA is required. PA can be initiated by calling [phone] or online at [email or web address]

## 2022-05-16 NOTE — Telephone Encounter (Signed)
Prior Authorization initiated for PROLIA via [email or web address] KEY: [id number]

## 2022-05-18 NOTE — Telephone Encounter (Signed)
APPROVED  KeyMorrie Sheldon PA Case ID #: [id number] Valid: 03/31/21-03/07/23

## 2022-05-19 NOTE — Telephone Encounter (Signed)
Pt ready for scheduling on or after 06/02/22  Out-of-pocket cost due at time of visit: $327  Primary: [employer] Prolia co-insurance: 20% (approximately $302) Admin fee co-insurance: 20% (approximately $25)  Deductible: does not apply  Prior Auth: APPROVED PA Case ID #: [id number] Valid: 03/31/21-03/07/23  Secondary: n/a Prolia co-insurance:  Admin fee co-insurance:   Deductible: n/a  Prior Auth: n/a PA# Valid:   ** This summary of benefits is an estimation of the patient's out-of-pocket cost. Exact cost may vary based on individual plan coverage.

## 2022-06-01 ENCOUNTER — Ambulatory Visit: Payer: Medicare HMO | Admitting: Endocrinology

## 2022-06-01 VITALS — BP 124/82 | HR 82 | Ht 64.0 in | Wt 156.0 lb

## 2022-06-01 DIAGNOSIS — M810 Age-related osteoporosis without current pathological fracture: Secondary | ICD-10-CM

## 2022-06-01 MED ORDER — DENOSUMAB 60 MG/ML ~~LOC~~ SOSY
60.0000 mg | PREFILLED_SYRINGE | Freq: Once | SUBCUTANEOUS | Status: AC
  Administered 2022-06-01: 60 mg via SUBCUTANEOUS

## 2022-06-01 NOTE — Progress Notes (Signed)
Administered prolia 60 mg/ml left arm subQ. Pt tolerated well. F/u up 6 months.

## 2022-06-22 NOTE — Telephone Encounter (Signed)
Last Prolia inj 06/01/22 Next Prolia inj due 12/03/22 

## 2022-07-10 ENCOUNTER — Emergency Department (HOSPITAL_BASED_OUTPATIENT_CLINIC_OR_DEPARTMENT_OTHER): Payer: Medicare HMO

## 2022-07-10 ENCOUNTER — Encounter (HOSPITAL_BASED_OUTPATIENT_CLINIC_OR_DEPARTMENT_OTHER): Payer: Self-pay | Admitting: Emergency Medicine

## 2022-07-10 ENCOUNTER — Emergency Department (HOSPITAL_BASED_OUTPATIENT_CLINIC_OR_DEPARTMENT_OTHER)
Admission: EM | Admit: 2022-07-10 | Discharge: 2022-07-10 | Disposition: A | Discharge status: Home / Self Care | Payer: Medicare HMO | Attending: Emergency Medicine | Admitting: Emergency Medicine

## 2022-07-10 ENCOUNTER — Ambulatory Visit
Admission: EM | Admit: 2022-07-10 | Discharge: 2022-07-10 | Disposition: A | Discharge status: Other Acute Inpatient Hospital | Payer: Medicare HMO | Attending: Internal Medicine | Admitting: Internal Medicine

## 2022-07-10 DIAGNOSIS — M545 Low back pain, unspecified: Secondary | ICD-10-CM

## 2022-07-10 DIAGNOSIS — R1030 Lower abdominal pain, unspecified: Secondary | ICD-10-CM

## 2022-07-10 DIAGNOSIS — E119 Type 2 diabetes mellitus without complications: Secondary | ICD-10-CM | POA: Diagnosis not present

## 2022-07-10 LAB — COMPREHENSIVE METABOLIC PANEL
ALT: 58 U/L — ABNORMAL HIGH (ref 0–44)
AST: 55 U/L — ABNORMAL HIGH (ref 15–41)
Albumin: 4 g/dL (ref 3.5–5.0)
Alkaline Phosphatase: 61 U/L (ref 38–126)
Anion gap: 6 (ref 5–15)
BUN: 15 mg/dL (ref 8–23)
CO2: 25 mmol/L (ref 22–32)
Calcium: 10.3 mg/dL (ref 8.9–10.3)
Chloride: 107 mmol/L (ref 98–111)
Creatinine, Ser: 0.73 mg/dL (ref 0.44–1.00)
GFR, Estimated: >60 mL/min (ref >60)
Glucose, Bld: 116 mg/dL — ABNORMAL HIGH (ref 70–99)
Potassium: 4.1 mmol/L (ref 3.5–5.1)
Sodium: 138 mmol/L (ref 135–145)
Total Bilirubin: 0.5 mg/dL (ref 0.3–1.2)
Total Protein: 7.2 g/dL (ref 6.5–8.1)

## 2022-07-10 LAB — CBC WITH DIFFERENTIAL/PLATELET
Abs Immature Granulocytes: 0.01 10*3/uL (ref 0.00–0.07)
Basophils Absolute: 0.1 10*3/uL (ref 0.0–0.1)
Basophils Relative: 1 %
Eosinophils Absolute: 0.2 10*3/uL (ref 0.0–0.5)
Eosinophils Relative: 3 %
HCT: 41 % (ref 36.0–46.0)
Hemoglobin: 13.7 g/dL (ref 12.0–15.0)
Immature Granulocytes: 0 %
Lymphocytes Relative: 30 %
Lymphs Abs: 1.8 10*3/uL (ref 0.7–4.0)
MCH: 30.3 pg (ref 26.0–34.0)
MCHC: 33.4 g/dL (ref 30.0–36.0)
MCV: 90.7 fL (ref 80.0–100.0)
Monocytes Absolute: 0.5 10*3/uL (ref 0.1–1.0)
Monocytes Relative: 8 %
Neutro Abs: 3.5 10*3/uL (ref 1.7–7.7)
Neutrophils Relative %: 58 %
Platelets: 225 10*3/uL (ref 150–400)
RBC: 4.52 MIL/uL (ref 3.87–5.11)
RDW: 13.6 % (ref 11.5–15.5)
WBC: 6 10*3/uL (ref 4.0–10.5)
nRBC: 0 % (ref 0.0–0.2)

## 2022-07-10 LAB — URINALYSIS, ROUTINE W REFLEX MICROSCOPIC
Bilirubin Urine: NEGATIVE
Glucose, UA: NEGATIVE mg/dL
Hgb urine dipstick: NEGATIVE
Ketones, ur: NEGATIVE mg/dL
Leukocytes,Ua: NEGATIVE
Nitrite: NEGATIVE
Protein, ur: NEGATIVE mg/dL
Specific Gravity, Urine: 1.006 (ref 1.005–1.030)
pH: 6 (ref 5.0–8.0)

## 2022-07-10 LAB — POCT URINALYSIS DIP (MANUAL ENTRY)
Bilirubin, UA: NEGATIVE
Blood, UA: NEGATIVE
Glucose, UA: NEGATIVE mg/dL
Ketones, POC UA: NEGATIVE mg/dL
Leukocytes, UA: NEGATIVE
Nitrite, UA: NEGATIVE
Protein Ur, POC: NEGATIVE mg/dL
Spec Grav, UA: 1.02 (ref 1.010–1.025)
Urobilinogen, UA: 1 E.U./dL
pH, UA: 6 (ref 5.0–8.0)

## 2022-07-10 MED ORDER — ACETAMINOPHEN 325 MG PO TABS
650.0000 mg | ORAL_TABLET | Freq: Once | ORAL | Status: AC
  Administered 2022-07-10: 650 mg via ORAL
  Filled 2022-07-10: qty 2

## 2022-07-10 MED ORDER — IOHEXOL 300 MG/ML  SOLN
80.0000 mL | Freq: Once | INTRAMUSCULAR | Status: AC | PRN
  Administered 2022-07-10: 80 mL via INTRAVENOUS

## 2022-07-10 NOTE — ED Triage Notes (Signed)
Pt arrives pov, steady gait with c/o lower abd pain  and bilateral lower back pain x 1 week, worsening yesterday. Endorses referral by UC. Advil at 12pm with mild relief

## 2022-07-10 NOTE — ED Provider Notes (Signed)
[facility]    CSN: [id number] Arrival date & time: 07/10/22  4098      History   Chief Complaint Chief Complaint  Patient presents with   UTI Symptoms    HPI Jillian Powell is a 72 y.o. female.   Patient presents with bilateral lower abdominal pain that started a few days ago.  Patient is not able to characterize the pain but reports it is 8/10 on pain scale and is constant.  Denies nausea, vomiting, diarrhea, constipation.  Reports last bowel movement was yesterday and denies blood in stool.  Denies dysuria, urinary frequency, hematuria, vaginal pain, vaginal discharge.  Patient denies that she is sexually active.  Reports that she had similar pain in the past and was told that she had a UTI so she is concerned for this today.     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      Home Medications    Prior to Admission medications   Medication Sig Start Date End Date Taking? Authorizing Provider  levocetirizine (XYZAL) 5 MG tablet Take 5 mg by mouth daily as needed for allergies.  02/26/19  Yes [provider]  LOTREL 5-10 MG capsule Take 1 capsule by mouth daily.  04/18/17  Yes [provider]  PAXLOVID, 300/100, 20 x 150 MG & 10 x 100MG  TBPK Take 3 tablets by mouth 2 (two) times daily. 05/05/22  Yes [provider]  TRUE METRIX BLOOD GLUCOSE TEST test strip  05/05/22  Yes [provider]  TRUEplus Lancets 33G MISC  05/05/22  Yes [provider]  ASCORBIC ACID PO Take 1 tablet by mouth daily.     [provider]  benzonatate (TESSALON) 200 MG capsule Take 200 mg by mouth 3 (three) times daily as needed. 05/05/22   [provider]  Blood Glucose Monitoring Suppl (TGT BLOOD GLUCOSE MONITORING) w/Device KIT Use 1 unit as directed once a day  to check blood sugars. 05/05/22   [provider]  Cholecalciferol 25 MCG (1000 UT) tablet Take 1,000 Units by mouth daily.    [provider]  hydrochlorothiazide (HYDRODIURIL) 12.5 MG tablet Take 1 tablet (12.5 mg total) by mouth daily. Patient not taking: Reported on 09/28/2021 07/14/21   Reather Littler, MD  ibuprofen (ADVIL) 200 MG tablet Take 600 mg by mouth 2 (two) times daily as needed for headache or moderate pain.    [provider]  Multiple Vitamins-Minerals (ZINC PO) Take 1 capsule by mouth daily.    [provider]  Polyethyl Glycol-Propyl Glycol (SYSTANE) 0.4-0.3 % SOLN Place 1 drop into the right eye daily as needed (Dry eyes).    [provider]  traZODone (DESYREL) 50 MG tablet Take 100 mg by mouth at bedtime as needed. 06/22/22   [provider]    Family History Family History  Problem Relation Age of Onset   Diabetes Mother    CAD Mother    Other Father        old age died at  106   Thyroid disease Neg Hx    Breast cancer Neg Hx    Colon cancer Neg Hx    Liver cancer Neg Hx     Social History Social History   Tobacco Use   Smoking status: Never   Smokeless tobacco: Never  Vaping Use   Vaping Use: Never used  Substance Use Topics   Alcohol use: No   Drug use: No     Allergies   Shellfish allergy, Shrimp extract, Penicillins, and Sulfa antibiotics   Review of Systems Review of Systems Per HPI  Physical Exam Triage Vital Signs ED Triage Vitals  Enc Vitals Group     BP 07/10/22 0953 121/77     Pulse Rate 07/10/22 0953 72     Resp 07/10/22 0953 18     Temp 07/10/22 0953 97.6 F (36.4 C)     Temp Source 07/10/22 0953 Oral     SpO2 07/10/22  0953 96 %     Weight 07/10/22 0949 155 lb (70.3 kg)     Height 07/10/22 0949 5' 5.25" (1.657 m)     Head Circumference --      Peak Flow --      Pain Score 07/10/22 0948 8     Pain Loc --      Pain Edu? --      Excl. in GC? --    No data found.  Updated Vital Signs BP 121/77 (BP Location: Left Arm)   Pulse 72   Temp 97.6 F (36.4 C) (Oral)   Resp 18   Ht 5' 5.25" (1.657 m)   Wt 155 lb (70.3 kg)   SpO2 96%   BMI 25.60 kg/m   Visual Acuity Right Eye Distance:   Left Eye Distance:   Bilateral Distance:    Right Eye Near:   Left Eye Near:    Bilateral Near:     Physical Exam Constitutional:      General: She is not in acute distress.    Appearance: Normal appearance. She is not toxic-appearing or diaphoretic.  HENT:     Head: Normocephalic and atraumatic.  Eyes:     Extraocular Movements: Extraocular movements intact.     Conjunctiva/sclera: Conjunctivae normal.  Cardiovascular:     Rate and Rhythm: Normal rate and regular rhythm.     Pulses: Normal pulses.     Heart sounds: Normal heart sounds.  Pulmonary:     Effort: Pulmonary effort is normal. No respiratory distress.     Breath sounds: Normal breath sounds.  Abdominal:     General: Abdomen is flat. Bowel sounds are normal. There is no distension.     Palpations: Abdomen is soft.     Tenderness: There is abdominal tenderness.       Comments: Patient is significantly tender to palpation to bilateral lower abdomen.  Neurological:     General: No focal deficit present.     Mental Status: She is alert and oriented to person, place, and time. Mental status is at baseline.  Psychiatric:        Mood and Affect: Mood normal.        Behavior: Behavior normal.        Thought Content: Thought content normal.        Judgment: Judgment normal.      UC Treatments / Results  Labs (all labs ordered are listed, but only abnormal results are displayed) Labs Reviewed  POCT URINALYSIS DIP (MANUAL ENTRY)  EKG   Radiology No results found.  Procedures Procedures (including critical care time)  Medications Ordered in UC Medications - No data to display  Initial Impression / Assessment and Plan / UC Course  I have reviewed the triage vital signs and the nursing notes.  Pertinent labs & imaging results that were available during my care of the patient were reviewed by me and considered in my medical decision making (see chart for details).     UA was completely unremarkable.  Patient is significantly tender to palpation on abdominal exam which is concerning that she may need more extensive evaluation and CT imaging which cannot be provided here at urgent care.  Therefore, patient was advised to go to the ER and was agreeable with the plan. Vital signs stable at discharge.  Agree with patient self transport to the ER. Final Clinical Impressions(s) / UC Diagnoses   Final diagnoses:  Lower abdominal pain     Discharge Instructions      Please go to the ER as soon as you leave urgent care.     ED Prescriptions   None    PDMP not reviewed this encounter.   Gustavus Bryant, [place] 07/10/22 1129

## 2022-07-10 NOTE — ED Triage Notes (Signed)
"  I believe I have a bladder infection". Discomfort and pain (lower abd, plevic). "Hurts sometimes when I go to bathroom". No fever.

## 2022-07-10 NOTE — Discharge Instructions (Addendum)
Today your CT scan was reassuring.  There is no evidence of any kind of obstruction, kidney stones, or any kind of urinary tract infection.  Your pelvis looked reassuring.  This may be secondary to constipation, please take MiraLAX daily for the next week, and follow-up with your primary care doctor.

## 2022-07-10 NOTE — ED Provider Notes (Signed)
[place] EMERGENCY DEPARTMENT AT [facility] Provider Note   CSN: [id number] Arrival date & time: 07/10/22  1316     History  Chief Complaint  Patient presents with   Abdominal Pain    Jillian Powell is a 72 y.o. female, hx of diabetes, who presents to the ED 2/2 to bilateral lower abdominal pain and bilateral lower back pain for the last few days. Notes that she has felt distended along her bladder but has relief with urinating. Denies nausea, vomiting, diarrhea, urinary frequency, or urgency. No fever, chills. Denies any  trauma. Has taken ibuprofen without relief.     Home Medications Prior to Admission medications   Medication Sig Start Date End Date Taking? Authorizing Provider  ASCORBIC ACID PO Take 1 tablet by mouth daily.    [provider]  benzonatate (TESSALON) 200 MG capsule Take 200 mg by mouth 3 (three) times daily as needed. 05/05/22   [provider]  Blood Glucose Monitoring Suppl (TGT BLOOD GLUCOSE MONITORING) w/Device KIT Use 1 unit as directed once a day  to check blood sugars. 05/05/22   [provider]  Cholecalciferol 25 MCG (1000 UT) tablet Take 1,000 Units by mouth daily.    [provider]  hydrochlorothiazide (HYDRODIURIL) 12.5 MG tablet Take 1 tablet (12.5 mg total) by mouth daily. Patient not taking: Reported on 09/28/2021 07/14/21   Reather Littler, MD  ibuprofen (ADVIL) 200 MG tablet Take 600 mg by mouth 2 (two) times daily as needed for headache or moderate pain.    [provider]  levocetirizine (XYZAL) 5 MG tablet Take 5 mg by mouth daily as needed for allergies.  02/26/19   [provider]  LOTREL 5-10 MG capsule Take 1 capsule by mouth daily.  04/18/17   [provider]  Multiple Vitamins-Minerals (ZINC PO) Take 1 capsule by mouth daily.    [provider]  PAXLOVID, 300/100, 20 x 150 MG & 10 x 100MG  TBPK Take 3 tablets by mouth 2 (two) times daily. 05/05/22   [provider]  Polyethyl Glycol-Propyl Glycol (SYSTANE) 0.4-0.3 % SOLN Place 1 drop into the right eye daily as needed (Dry eyes).    [provider]  traZODone (DESYREL) 50 MG tablet Take 100 mg by mouth at bedtime as needed. 06/22/22   [provider]  TRUE METRIX BLOOD GLUCOSE TEST test strip  05/05/22   [provider]  TRUEplus Lancets 33G MISC  05/05/22   [provider]      Allergies    Shellfish allergy, Shrimp extract, Penicillins, and Sulfa antibiotics    Review of Systems   Review of Systems  Gastrointestinal:  Positive for abdominal pain. Negative for nausea and vomiting.    Physical Exam Updated Vital Signs BP 128/67 (BP Location: Right Arm)   Pulse 81   Temp 98.3 F (36.8 C) (Oral)   Resp 16   Wt 70.3 kg   SpO2 95%   BMI 25.60 kg/m  Physical Exam Vitals and nursing note reviewed.  Constitutional:      General: She is not in acute distress.    Appearance: She is well-developed.  HENT:     Head: Normocephalic and atraumatic.  Eyes:     Conjunctiva/sclera: Conjunctivae normal.  Cardiovascular:     Rate and Rhythm: Normal rate and regular rhythm.     Heart sounds: No murmur heard. Pulmonary:     Effort: Pulmonary effort is normal. No respiratory distress.  Breath sounds: Normal breath sounds.  Abdominal:     Palpations: Abdomen is soft.     Tenderness: There is abdominal tenderness. There is no right CVA tenderness or left CVA tenderness.       Comments: TTP to bilateral areas above as well as back w/o CVA ttp  Musculoskeletal:        General: No swelling.     Cervical back: Neck supple.  Skin:    General: Skin is warm and dry.     Capillary Refill: Capillary refill takes less than 2 seconds.  Neurological:     Mental Status: She is alert.  Psychiatric:        Mood and Affect: Mood normal.     ED Results / Procedures / Treatments   Labs (all labs ordered are listed, but only abnormal results are  displayed) Labs Reviewed  URINALYSIS, ROUTINE W REFLEX MICROSCOPIC - Abnormal; Notable for the following components:      Result Value   Color, Urine COLORLESS (*)    All other components within normal limits  COMPREHENSIVE METABOLIC PANEL - Abnormal; Notable for the following components:   Glucose, Bld 116 (*)    AST 55 (*)    ALT 58 (*)    All other components within normal limits  CBC WITH DIFFERENTIAL/PLATELET    EKG None  Radiology CT ABDOMEN PELVIS W CONTRAST  Result Date: 07/10/2022 CLINICAL DATA:  Bilateral lower abdominal pain.  Low back pain. EXAM: CT ABDOMEN AND PELVIS WITH CONTRAST TECHNIQUE: Multidetector CT imaging of the abdomen and pelvis was performed using the standard protocol following bolus administration of intravenous contrast. RADIATION DOSE REDUCTION: This exam was performed according to the departmental dose-optimization program which includes automated exposure control, adjustment of the mA and/or kV according to patient size and/or use of iterative reconstruction technique. CONTRAST:  80mL OMNIPAQUE IOHEXOL 300 MG/ML  SOLN COMPARISON:  04/19/2012. FINDINGS: Lower chest: Lung bases essentially clear. Hepatobiliary: Liver normal in size. Decreased liver attenuation consistent with fatty infiltration. Several gallstones. Gallbladder mostly collapsed. No acute cholecystitis. No bile duct dilation. Pancreas: Choose 1 Spleen: Spleen normal in size. Scattered Shane Badeaux calcifications consistent with healed granuloma. No mass. Adrenals/Urinary Tract: Normal adrenal glands. Kidneys normal in size, orientation and position with symmetric enhancement and excretion. 1.2 cm lower pole left renal cyst. Several tiny additional low-attenuation renal lesions are noted, also consistent with cysts. No follow-up recommended. No renal stones. No hydronephrosis. Normal ureters. Normal bladder. Stomach/Bowel: Normal stomach. Yasmin Dibello bowel and colon are normal in caliber. No wall thickening. No  inflammation. Scattered colonic diverticula. No diverticulitis. Normal appendix visualized. Vascular/Lymphatic: Aortic atherosclerosis. No aneurysm. No enlarged lymph nodes. Reproductive: Uterus and bilateral adnexa are unremarkable. Other: No abdominal wall hernia or abnormality. No abdominopelvic ascites. Musculoskeletal: No fracture or acute finding.  No bone lesion. IMPRESSION: 1. No acute findings within the abdomen or pelvis. No findings to account for abdominal pain. 2. Hepatic steatosis.  Cholelithiasis without acute cholecystitis. 3. Aortic atherosclerosis. Electronically Signed   By: Amie Portland M.D.   On: 07/10/2022 15:27    Procedures Procedures    Medications Ordered in ED Medications  acetaminophen (TYLENOL) tablet 650 mg (650 mg Oral Given 07/10/22 1419)  iohexol (OMNIPAQUE) 300 MG/ML solution 80 mL (80 mLs Intravenous Contrast Given 07/10/22 1504)    ED Course/ Medical Decision Making/ A&P  Medical Decision Making Patient is a 72 year old female, here for bilateral lower abdominal pain, bilateral back pain, ongoing for the last several days.  She has mild tenderness to palpation, no overt distention, ecchymosis, or rashes.  We obtained a CT scan for further evaluation.  She denies any nausea, vomiting, fever, chills.  Only history of a tubal ligation, no other abdominal surgeries.  Amount and/or Complexity of Data Reviewed Labs: ordered.    Details: Labs unremarkable--urine negative Radiology: ordered.    Details: CT abdomen pelvis, shows no acute findings, she got a little bit of cholelithiasis, without any finding of Cholecystitis.  She has scattered stool.  Possible that her stool is causing some of this pressure, versus she is having a distended bladder, and urination relieves this.  We will have her follow-up with her primary care doctor, and put her on MiraLAX for 7 days, to help clean her out a little bit to help fully get rid of some of the  pressure.  Discharged with strict return precautions, well-appearing on exam.  Denies any chest pain or shortness of breath.  Risk OTC drugs. Prescription drug management.   Final Clinical Impression(s) / ED Diagnoses Final diagnoses:  Lower abdominal pain  Acute bilateral low back pain without sciatica    Rx / DC Orders ED Discharge Orders     None         Pete Pelt, PA 07/10/22 1540    Rondel Baton, MD 07/12/22 1228

## 2022-07-10 NOTE — ED Notes (Signed)
Discharge paperwork given and verbally understood. 

## 2022-07-10 NOTE — Discharge Instructions (Signed)
Please go to the ER as soon as you leave urgent care.  

## 2022-07-25 ENCOUNTER — Ambulatory Visit: Payer: Medicare HMO | Admitting: Podiatry

## 2022-07-25 DIAGNOSIS — B351 Tinea unguium: Secondary | ICD-10-CM | POA: Diagnosis not present

## 2022-07-25 MED ORDER — CICLOPIROX 8 % EX SOLN: Freq: Every day | CUTANEOUS | 11 refills | Status: AC

## 2022-07-25 NOTE — Progress Notes (Signed)
       Subjective:  Patient ID: Jillian Powell, female    DOB: [birth year],  MRN: [id number]   Jillian Powell presents to clinic today for:  Chief Complaint  Patient presents with   Nail Problem    Rm 24 Left 2nd nail discoloration and thickness x 1 year. Pt has only tried otc antifungal solution that does not help.    . Patient notes her left second toenail is thickened, discolored, and appears as if there is a hollow section in the nail.  Denies trauma.  She has tried over-the-counter antifungals on the toenail.  She does have toenail polish on all of the other nails except the left second nail in question today.  PCP is Tommie Sams, DO.  Allergies  Allergen Reactions   Shellfish Allergy Anaphylaxis   Shrimp Extract Itching   Penicillins Hives, Itching and Rash   Sulfa Antibiotics Hives, Itching and Rash    Review of Systems: Negative except as noted in the HPI.  Objective:  There were no vitals filed for this visit.  Jillian Powell is a pleasant 72 y.o. female in NAD. AAO x 3.  Vascular Examination: Capillary refill time is 3-5 seconds to toes bilateral. Palpable pedal pulses b/l LE. Digital hair present b/l. No pedal edema b/l. Skin temperature gradient WNL b/l. No varicosities b/l. No cyanosis or clubbing noted b/l.   Dermatological Examination: Pedal skin with normal turgor, texture and tone b/l. No open wounds. No interdigital macerations b/l.  Cannot evaluate the color of any of the other nails besides the left second toenail due to toenail polish on today.  The left second toenail is 3 to 4 mm thick, with subungual debris, distal onycholysis and has a small hollow defect within the nail proximately 50% towards the eponychium.  This is stable  Neurological Examination: Protective sensation intact bilateral LE.   Assessment/Plan: 1. Dermatophytosis of nail     Meds ordered this encounter  Medications   ciclopirox (PENLAC) 8 % solution    Sig: Apply  topically at bedtime. Apply over nail and surrounding skin. Apply daily over previous coat. Remove weekly with polish remover.    Dispense:  6.6 mL    Refill:  11    The mycotic toenail on the left second toe was sharply debrided with sterile nail nippers today.  Discussed antifungal therapy with the patient.  Prescription written for ciclopirox solution/lacquer to be applied daily to the affected toenail.  She was instructed that every 7 days she needs to remove the lacquer and then begin again.  She was informed this may take 8 to 12 months for resolution of the nail.  Will plan on debridement of the nail at her follow-up visit and reassess how the topical is working.  Return in about 3 months (around 10/25/2022) for fungal nail recheck.   Clerance Lav, DPM, FACFAS [facility]     [address] [place], [place] [address]                Office [phone]  Fax [phone]

## 2022-07-27 ENCOUNTER — Ambulatory Visit: Payer: Self-pay | Admitting: Surgery

## 2022-08-03 NOTE — Patient Instructions (Addendum)
SURGICAL WAITING ROOM VISITATION Patients having surgery or a procedure may have no more than 2 support people in the waiting area - these visitors may rotate.    Children under the age of 35 must have an adult with them who is not the patient.  If the patient needs to stay at the hospital during part of their recovery, the visitor guidelines for inpatient rooms apply. Pre-op nurse will coordinate an appropriate time for 1 support person to accompany patient in pre-op.  This support person may not rotate.    Please refer to the [facility] website for the visitor guidelines for Inpatients (after your surgery is over and you are in a regular room).       Your procedure is scheduled on: 08-08-22   Report to [facility] Main Entrance    Report to admitting at 7:45 AM   Call this number if you have problems the morning of surgery [phone]   Do not eat food :After Midnight.   After Midnight you may have the following liquids until 7:00 AM DAY OF SURGERY  Water Non-Citrus Juices (without pulp, NO RED-Apple, White grape, White cranberry) Black Coffee (NO MILK/CREAM OR CREAMERS, sugar ok)  Clear Tea (NO MILK/CREAM OR CREAMERS, sugar ok) regular and decaf                             Plain Jell-O (NO RED)                                           Fruit ices (not with fruit pulp, NO RED)                                     Popsicles (NO RED)                                                               Sports drinks like Gatorade (NO RED)                       If you have questions, please contact your surgeon's office.   FOLLOW ANY ADDITIONAL PRE OP INSTRUCTIONS YOU RECEIVED FROM YOUR SURGEON'S OFFICE!!!     Oral Hygiene is also important to reduce your risk of infection.                                    Remember - BRUSH YOUR TEETH THE MORNING OF SURGERY WITH YOUR REGULAR TOOTHPASTE   Do NOT smoke after Midnight  Take these medicines the morning of surgery with A SIP OF  WATER: None                              You may not have any metal on your body including hair pins, jewelry, and body piercing             Do not wear make-up, lotions,  powders, perfumes or deodorant  Do not wear nail polish including gel and S&S, artificial/acrylic nails, or any other type of covering on natural nails including finger and toenails. If you have artificial nails, gel coating, etc. that needs to be removed by a nail salon please have this removed prior to surgery or surgery may need to be canceled/ delayed if the surgeon/ anesthesia feels like they are unable to be safely monitored.   Do not shave  48 hours prior to surgery.    Do not bring valuables to the hospital. [facility] IS NOT RESPONSIBLE   FOR VALUABLES.   Contacts, dentures or bridgework may not be worn into surgery.  DO NOT BRING YOUR HOME MEDICATIONS TO THE HOSPITAL. PHARMACY WILL DISPENSE MEDICATIONS LISTED ON YOUR MEDICATION LIST TO YOU DURING YOUR ADMISSION IN THE HOSPITAL!    Patients discharged on the day of surgery will not be allowed to drive home.  Someone NEEDS to stay with you for the first 24 hours after anesthesia.   Special Instructions: Bring a copy of your healthcare power of attorney and living will documents the day of surgery if you haven't scanned them before.              Please read over the following fact sheets you were given: IF YOU HAVE QUESTIONS ABOUT YOUR PRE-OP INSTRUCTIONS PLEASE CALL [phone] [place]  If you received a COVID test during your pre-op visit  it is requested that you wear a mask when out in public, stay away from anyone that may not be feeling well and notify your surgeon if you develop symptoms. If you test positive for Covid or have been in contact with anyone that has tested positive in the last 10 days please notify you surgeon.  [facility] - Preparing for Surgery Before surgery, you can play an important role.  Because skin is not sterile, your skin needs to be  as free of germs as possible.  You can reduce the number of germs on your skin by washing with CHG (chlorahexidine gluconate) soap before surgery.  CHG is an antiseptic cleaner which kills germs and bonds with the skin to continue killing germs even after washing. Please DO NOT use if you have an allergy to CHG or antibacterial soaps.  If your skin becomes reddened/irritated stop using the CHG and inform your nurse when you arrive at Short Stay. Do not shave (including legs and underarms) for at least 48 hours prior to the first CHG shower.  You may shave your face/neck.  Please follow these instructions carefully:  1.  Shower with CHG Soap the night before surgery and the  morning of surgery.  2.  If you choose to wash your hair, wash your hair first as usual with your normal  shampoo.  3.  After you shampoo, rinse your hair and body thoroughly to remove the shampoo.                             4.  Use CHG as you would any other liquid soap.  You can apply chg directly to the skin and wash.  Gently with a scrungie or clean washcloth.  5.  Apply the CHG Soap to your body ONLY FROM THE NECK DOWN.   Do   not use on face/ open  Wound or open sores. Avoid contact with eyes, ears mouth and   genitals (private parts).                       Wash face,  Genitals (private parts) with your normal soap.             6.  Wash thoroughly, paying special attention to the area where your    surgery  will be performed.  7.  Thoroughly rinse your body with warm water from the neck down.  8.  DO NOT shower/wash with your normal soap after using and rinsing off the CHG Soap.                9.  Pat yourself dry with a clean towel.            10.  Wear clean pajamas.            11.  Place clean sheets on your bed the night of your first shower and do not  sleep with pets. Day of Surgery : Do not apply any lotions/deodorants the morning of surgery.  Please wear clean clothes to the  hospital/surgery center.  FAILURE TO FOLLOW THESE INSTRUCTIONS MAY RESULT IN THE CANCELLATION OF YOUR SURGERY  PATIENT SIGNATURE_________________________________  NURSE SIGNATURE__________________________________  ________________________________________________________________________

## 2022-08-03 NOTE — Progress Notes (Addendum)
COVID Vaccine Completed:  Yes  Date of COVID positive in last 90 days:  Covid 04/2022  PCP - Elesa Massed, MD Cardiologist - Vilinda Boehringer, MD (x1 in 2016) for chest pain.  Determined to be GERD  Chest x-ray - N/A EKG - 08-05-22 Epic Stress Test - 2016 Echo - N/A Cardiac Cath - N/A Pacemaker/ICD device last checked: Spinal Cord Stimulator:N/A  Bowel Prep - N/A  Sleep Study - N/A CPAP -   Prediabetic, CBG 146 at PAT Fasting Blood Sugar -  Checks Blood Sugar - does not check   Last dose of GLP1 agonist-  N/A GLP1 instructions:  N/A   Last dose of SGLT-2 inhibitors-  N/A SGLT-2 instructions: N/A  Blood Thinner Instructions:  Time Aspirin Instructions: Last Dose:  Activity level:  Can go up a flight of stairs and perform activities of daily living without stopping and without symptoms of chest pain or shortness of breath.  Able to exercise without symptoms  Anesthesia review:  Chest pain evaluated by cardiology in 2016, stress test completed. Determined to be GERD.  HTN, DM  Patient denies shortness of breath, fever, cough and chest pain at PAT appointment  Patient verbalized understanding of instructions that were given to them at the PAT appointment. Patient was also instructed that they will need to review over the PAT instructions again at home before surgery.

## 2022-08-05 ENCOUNTER — Encounter (HOSPITAL_COMMUNITY): Payer: Self-pay

## 2022-08-05 ENCOUNTER — Encounter (HOSPITAL_COMMUNITY): Payer: Self-pay | Admitting: Surgery

## 2022-08-05 ENCOUNTER — Encounter (HOSPITAL_COMMUNITY)
Admission: RE | Admit: 2022-08-05 | Discharge: 2022-08-05 | Disposition: A | Discharge status: Home / Self Care | Payer: Medicare HMO | Source: Ambulatory Visit | Attending: Surgery | Admitting: Surgery

## 2022-08-05 ENCOUNTER — Other Ambulatory Visit: Payer: Self-pay

## 2022-08-05 VITALS — BP 142/80 | HR 71 | Temp 98.5°F | Resp 16 | Ht 64.75 in | Wt 153.0 lb

## 2022-08-05 DIAGNOSIS — E119 Type 2 diabetes mellitus without complications: Secondary | ICD-10-CM | POA: Diagnosis not present

## 2022-08-05 DIAGNOSIS — I10 Essential (primary) hypertension: Secondary | ICD-10-CM

## 2022-08-05 DIAGNOSIS — Z01818 Encounter for other preprocedural examination: Secondary | ICD-10-CM | POA: Insufficient documentation

## 2022-08-05 HISTORY — DX: Essential (primary) hypertension: I10

## 2022-08-05 HISTORY — DX: Prediabetes: R73.03

## 2022-08-05 HISTORY — DX: Unspecified osteoarthritis, unspecified site: M19.90

## 2022-08-05 HISTORY — DX: Gastro-esophageal reflux disease without esophagitis: K21.9

## 2022-08-05 HISTORY — DX: Personal history of urinary calculi: Z87.442

## 2022-08-05 LAB — GLUCOSE, CAPILLARY: Glucose-Capillary: 146 mg/dL — ABNORMAL HIGH (ref 70–99)

## 2022-08-05 NOTE — H&P (Signed)
PROVIDER: Liese Dizdarevic Myra Rude, MD    Chief Complaint: Follow Up Visit (Primary hyperparathyroidism)  History of Present Illness:  Patient returns in order to discuss results of multiple imaging studies and repeat laboratory studies. Patient had previously undergone an ultrasound examination of the neck on February 04, 2021. She did not have any significant thyroid nodules. However there was a 6 x 5 x 5 mm solid nodule adjacent to the inferior tip of the left thyroid lobe which was suspicious for parathyroid adenoma. After her last office visit she underwent a nuclear medicine parathyroid scan on February 24, 2022. This was negative for parathyroid adenoma. Patient then underwent an MRI scan of the neck on April 11, 2022. This also failed to identify evidence of a parathyroid adenoma. We repeated her laboratory studies on April 25, 2022. This showed a persistently elevated calcium of 11.5 and an unsuppressed intact PTH level of 34. Patient returns today to review these results with me in person and to discuss recommendations for management.   Review of Systems: A complete review of systems was obtained from the patient. I have reviewed this information and discussed as appropriate with the patient. See HPI as well for other ROS.  Review of Systems  Constitutional: Negative.  HENT: Negative.  Eyes: Negative.  Respiratory: Negative.  Cardiovascular: Negative.  Gastrointestinal: Negative.  Genitourinary: Negative.  Musculoskeletal: Negative.  Skin: Negative.  Neurological: Negative.  Endo/Heme/Allergies: Negative.  Psychiatric/Behavioral: Negative.    Medical History: Past Medical History:  Diagnosis Date  DVT (deep venous thrombosis) (CMS/HHS-HCC)   Patient Active Problem List  Diagnosis  Primary hyperparathyroidism (CMS/HHS-HCC)   Past Surgical History:  Procedure Laterality Date  CATARACT EXTRACTION  tubaligation surgery  VITREOUS RETINAL SURGERY    Allergies   Allergen Reactions  Penicillins Hives, Itching and Rash  Sulfa (Sulfonamide Antibiotics) Hives, Itching and Rash   Current Outpatient Medications on File Prior to Visit  Medication Sig Dispense Refill  amLODIPine-benazepril (LOTREL) 5-10 mg capsule   No current facility-administered medications on file prior to visit.   Family History  Problem Relation Age of Onset  Diabetes Mother    Social History   Tobacco Use  Smoking Status Never  Smokeless Tobacco Never    Social History   Socioeconomic History  Marital status: Married  Tobacco Use  Smoking status: Never  Smokeless tobacco: Never  Vaping Use  Vaping status: Never Used  Substance and Sexual Activity  Alcohol use: Not Currently  Drug use: Never   Social Determinants of Health   Received from [employer], [facility]  Social Network   Objective:   Vitals:  PainSc: 0-No pain   There is no height or weight on file to calculate BMI.  Physical Exam   Limited examination  Anterior neck is symmetric. Palpation shows no palpable nodularity or masses. There is no tenderness. Voice quality is normal.   Assessment and Plan:   Primary hyperparathyroidism (CMS/HHS-HCC)  Today we reviewed the results of her recent imaging studies as well as the recent repeat laboratory studies.  Based on her laboratory studies, I believe the patient does have primary hyperparathyroidism with an unsuppressed PTH level, elevated calcium level, and elevated 24-hour urine collection for calcium.  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  I have recommended proceeding with neck exploration with initial attention to the left inferior position. We would plan to perform an intraoperative frozen section biopsy to confirm the presence of the adenoma. Patient might require 4  gland exploration. I would anticipate an outpatient surgical procedure but an overnight stay might be necessary.  Today we  discussed the procedure. We discussed the size of the surgical incision. We discussed her postoperative recovery.  Copies of her studies are provided to the patient. She will discuss this with her husband and contact us with a decision regarding whether or not to proceed with surgery in the near future.   Darnell Level, MD [facility] practice Office: [phone]

## 2022-08-06 LAB — HEMOGLOBIN A1C
Hgb A1c MFr Bld: 6.7 % — ABNORMAL HIGH (ref 4.8–5.6)
Mean Plasma Glucose: 146 mg/dL

## 2022-08-06 NOTE — Anesthesia Preprocedure Evaluation (Addendum)
Anesthesia Evaluation  Patient identified by MRN, date of birth, ID band Patient awake    Reviewed: Allergy & Precautions, NPO status , Patient's Chart, lab work & pertinent test results  Airway Mallampati: II  TM Distance: >3 FB Neck ROM: Full    Dental no notable dental hx. (+) Teeth Intact, Dental Advisory Given   Pulmonary neg pulmonary ROS   Pulmonary exam normal breath sounds clear to auscultation       Cardiovascular hypertension, Pt. on medications + DVT  Normal cardiovascular exam Rhythm:Regular Rate:Normal     Neuro/Psych negative neurological ROS  negative psych ROS   GI/Hepatic Neg liver ROS,GERD  ,,  Endo/Other  diabetes, Type 2, Insulin Dependent    Renal/GU Lab Results      Component                Value               Date                      CREATININE               0.73                07/10/2022                        K                        4.1                 07/10/2022                     Musculoskeletal  (+) Arthritis , Osteoarthritis,    Abdominal   Peds  Hematology Lab Results      Component                Value               Date                         HGB                      13.7                07/10/2022                HCT                      41.0                07/10/2022                 PLT                      225                 07/10/2022              Anesthesia Other Findings All:PCN Sulfa  Reproductive/Obstetrics                              Anesthesia Physical Anesthesia Plan  ASA: 2  Anesthesia Plan: General   Post-op Pain Management: Precedex   Induction: Intravenous  PONV Risk Score and Plan: Treatment may vary due to age or medical condition, Midazolam and Ondansetron  Airway Management Planned: Oral ETT  Additional Equipment: None  Intra-op Plan:   Post-operative Plan: Extubation in OR  Informed Consent: I have reviewed the  patients History and Physical, chart, labs and discussed the procedure including the risks, benefits and alternatives for the proposed anesthesia with the patient or authorized representative who has indicated his/her understanding and acceptance.     Dental advisory given  Plan Discussed with: CRNA and Anesthesiologist  Anesthesia Plan Comments:          Anesthesia Quick Evaluation

## 2022-08-08 ENCOUNTER — Encounter (HOSPITAL_COMMUNITY): Payer: Self-pay | Admitting: Surgery

## 2022-08-08 ENCOUNTER — Ambulatory Visit (HOSPITAL_BASED_OUTPATIENT_CLINIC_OR_DEPARTMENT_OTHER): Payer: Medicare HMO | Admitting: Anesthesiology

## 2022-08-08 ENCOUNTER — Other Ambulatory Visit: Payer: Self-pay

## 2022-08-08 ENCOUNTER — Ambulatory Visit (HOSPITAL_COMMUNITY)
Admission: RE | Admit: 2022-08-08 | Discharge: 2022-08-09 | Disposition: A | Discharge status: Home / Self Care | Payer: Medicare HMO | Attending: Surgery | Admitting: Surgery

## 2022-08-08 ENCOUNTER — Encounter (HOSPITAL_COMMUNITY)
Admission: RE | Disposition: A | Discharge status: Home / Self Care | Payer: Self-pay | Source: Home / Self Care | Attending: Surgery

## 2022-08-08 ENCOUNTER — Ambulatory Visit (HOSPITAL_COMMUNITY): Payer: Medicare HMO | Admitting: Anesthesiology

## 2022-08-08 DIAGNOSIS — E210 Primary hyperparathyroidism: Secondary | ICD-10-CM | POA: Diagnosis present

## 2022-08-08 DIAGNOSIS — I10 Essential (primary) hypertension: Secondary | ICD-10-CM

## 2022-08-08 DIAGNOSIS — I82409 Acute embolism and thrombosis of unspecified deep veins of unspecified lower extremity: Secondary | ICD-10-CM | POA: Diagnosis not present

## 2022-08-08 DIAGNOSIS — E213 Hyperparathyroidism, unspecified: Secondary | ICD-10-CM

## 2022-08-08 DIAGNOSIS — Z794 Long term (current) use of insulin: Secondary | ICD-10-CM

## 2022-08-08 DIAGNOSIS — E119 Type 2 diabetes mellitus without complications: Secondary | ICD-10-CM

## 2022-08-08 HISTORY — PX: PARATHYROIDECTOMY: SHX19

## 2022-08-08 LAB — GLUCOSE, CAPILLARY
Glucose-Capillary: 136 mg/dL — ABNORMAL HIGH (ref 70–99)
Glucose-Capillary: 170 mg/dL — ABNORMAL HIGH (ref 70–99)
Glucose-Capillary: 204 mg/dL — ABNORMAL HIGH (ref 70–99)
Glucose-Capillary: 198 mg/dL — ABNORMAL HIGH (ref 70–99)

## 2022-08-08 SURGERY — PARATHYROIDECTOMY
Anesthesia: General | Laterality: Left

## 2022-08-08 MED ORDER — OXYCODONE HCL 5 MG PO TABS
5.0000 mg | ORAL_TABLET | ORAL | Status: DC | PRN
  Administered 2022-08-08: 5 mg via ORAL

## 2022-08-08 MED ORDER — HEMOSTATIC AGENTS (NO CHARGE) OPTIME
TOPICAL | Status: DC | PRN
  Administered 2022-08-08: 1 via TOPICAL

## 2022-08-08 MED ORDER — LIDOCAINE 20MG/ML (2%) 15 ML SYRINGE OPTIME
INTRAMUSCULAR | Status: DC | PRN
  Administered 2022-08-08: 1.5 mg/kg/h via INTRAVENOUS

## 2022-08-08 MED ORDER — INSULIN ASPART 100 UNIT/ML IJ SOLN
0.0000 [IU] | Freq: Three times a day (TID) | INTRAMUSCULAR | Status: DC
  Administered 2022-08-08: 5 [IU] via SUBCUTANEOUS
  Administered 2022-08-09: 2 [IU] via SUBCUTANEOUS

## 2022-08-08 MED ORDER — POLYVINYL ALCOHOL 1.4 % OP SOLN: 1.0000 [drp] | Freq: Every evening | OPHTHALMIC | Status: DC | PRN

## 2022-08-08 MED ORDER — ONDANSETRON 4 MG PO TBDP: 4.0000 mg | ORAL_TABLET | Freq: Four times a day (QID) | ORAL | Status: DC | PRN

## 2022-08-08 MED ORDER — SUGAMMADEX SODIUM 200 MG/2ML IV SOLN
INTRAVENOUS | Status: DC | PRN
  Administered 2022-08-08: 200 mg via INTRAVENOUS

## 2022-08-08 MED ORDER — ONDANSETRON HCL 4 MG/2ML IJ SOLN: 4.0000 mg | Freq: Once | INTRAMUSCULAR | Status: DC | PRN

## 2022-08-08 MED ORDER — CIPROFLOXACIN IN D5W 400 MG/200ML IV SOLN
400.0000 mg | INTRAVENOUS | Status: AC
  Administered 2022-08-08: 400 mg via INTRAVENOUS
  Filled 2022-08-08: qty 200

## 2022-08-08 MED ORDER — ONDANSETRON HCL 4 MG/2ML IJ SOLN
INTRAMUSCULAR | Status: DC | PRN
  Administered 2022-08-08: 4 mg via INTRAVENOUS

## 2022-08-08 MED ORDER — ACETAMINOPHEN 10 MG/ML IV SOLN
1000.0000 mg | Freq: Once | INTRAVENOUS | Status: DC | PRN
  Administered 2022-08-08: 1000 mg via INTRAVENOUS

## 2022-08-08 MED ORDER — MIDAZOLAM HCL 2 MG/2ML IJ SOLN
INTRAMUSCULAR | Status: AC
  Filled 2022-08-08: qty 2

## 2022-08-08 MED ORDER — EPHEDRINE 5 MG/ML INJ
INTRAVENOUS | Status: AC
  Filled 2022-08-08: qty 5

## 2022-08-08 MED ORDER — ACETAMINOPHEN 650 MG RE SUPP: 650.0000 mg | Freq: Four times a day (QID) | RECTAL | Status: DC | PRN

## 2022-08-08 MED ORDER — BENAZEPRIL HCL 20 MG PO TABS
10.0000 mg | ORAL_TABLET | Freq: Every day | ORAL | Status: DC
  Administered 2022-08-08: 10 mg via ORAL
  Filled 2022-08-08 (×2): qty 1

## 2022-08-08 MED ORDER — DEXAMETHASONE SODIUM PHOSPHATE 10 MG/ML IJ SOLN
INTRAMUSCULAR | Status: AC
  Filled 2022-08-08: qty 1

## 2022-08-08 MED ORDER — FENTANYL CITRATE PF 50 MCG/ML IJ SOSY
PREFILLED_SYRINGE | INTRAMUSCULAR | Status: AC
  Administered 2022-08-08: 50 ug via INTRAVENOUS
  Filled 2022-08-08: qty 1

## 2022-08-08 MED ORDER — FENTANYL CITRATE PF 50 MCG/ML IJ SOSY
PREFILLED_SYRINGE | INTRAMUSCULAR | Status: AC
  Filled 2022-08-08: qty 1

## 2022-08-08 MED ORDER — AMLODIPINE BESYLATE 5 MG PO TABS
5.0000 mg | ORAL_TABLET | Freq: Every day | ORAL | Status: DC
  Administered 2022-08-08: 5 mg via ORAL
  Filled 2022-08-08 (×2): qty 1

## 2022-08-08 MED ORDER — ACETAMINOPHEN 325 MG PO TABS
650.0000 mg | ORAL_TABLET | Freq: Four times a day (QID) | ORAL | Status: DC | PRN
  Administered 2022-08-09: 650 mg via ORAL
  Filled 2022-08-08: qty 2

## 2022-08-08 MED ORDER — CHLORHEXIDINE GLUCONATE CLOTH 2 % EX PADS: 6.0000 | MEDICATED_PAD | Freq: Once | CUTANEOUS | Status: DC

## 2022-08-08 MED ORDER — SODIUM CHLORIDE 0.45 % IV SOLN: INTRAVENOUS | Status: DC

## 2022-08-08 MED ORDER — FENTANYL CITRATE (PF) 100 MCG/2ML IJ SOLN
INTRAMUSCULAR | Status: AC
  Filled 2022-08-08: qty 2

## 2022-08-08 MED ORDER — ORAL CARE MOUTH RINSE: 15.0000 mL | Freq: Once | OROMUCOSAL | Status: AC

## 2022-08-08 MED ORDER — LIDOCAINE HCL (PF) 2 % IJ SOLN
INTRAMUSCULAR | Status: AC
  Filled 2022-08-08: qty 5

## 2022-08-08 MED ORDER — DEXAMETHASONE SODIUM PHOSPHATE 10 MG/ML IJ SOLN
INTRAMUSCULAR | Status: DC | PRN
  Administered 2022-08-08: 10 mg via INTRAVENOUS

## 2022-08-08 MED ORDER — ONDANSETRON HCL 4 MG/2ML IJ SOLN
INTRAMUSCULAR | Status: AC
  Filled 2022-08-08: qty 2

## 2022-08-08 MED ORDER — TRAMADOL HCL 50 MG PO TABS: 50.0000 mg | ORAL_TABLET | Freq: Four times a day (QID) | ORAL | Status: DC | PRN

## 2022-08-08 MED ORDER — BUPIVACAINE HCL (PF) 0.25 % IJ SOLN
INTRAMUSCULAR | Status: AC
  Filled 2022-08-08: qty 30

## 2022-08-08 MED ORDER — LACTATED RINGERS IV SOLN: INTRAVENOUS | Status: DC

## 2022-08-08 MED ORDER — ACETAMINOPHEN 10 MG/ML IV SOLN
INTRAVENOUS | Status: AC
  Filled 2022-08-08: qty 100

## 2022-08-08 MED ORDER — MIDAZOLAM HCL 5 MG/5ML IJ SOLN
INTRAMUSCULAR | Status: DC | PRN
  Administered 2022-08-08: 2 mg via INTRAVENOUS

## 2022-08-08 MED ORDER — HYDROMORPHONE HCL 1 MG/ML IJ SOLN
1.0000 mg | INTRAMUSCULAR | Status: DC | PRN
  Administered 2022-08-08 (×2): 1 mg via INTRAVENOUS
  Filled 2022-08-08 (×3): qty 1

## 2022-08-08 MED ORDER — ROCURONIUM BROMIDE 100 MG/10ML IV SOLN
INTRAVENOUS | Status: DC | PRN
  Administered 2022-08-08: 10 mg via INTRAVENOUS
  Administered 2022-08-08: 50 mg via INTRAVENOUS

## 2022-08-08 MED ORDER — CHLORHEXIDINE GLUCONATE 0.12 % MT SOLN
15.0000 mL | Freq: Once | OROMUCOSAL | Status: AC
  Administered 2022-08-08: 15 mL via OROMUCOSAL

## 2022-08-08 MED ORDER — ONDANSETRON HCL 4 MG/2ML IJ SOLN
4.0000 mg | Freq: Four times a day (QID) | INTRAMUSCULAR | Status: DC | PRN
  Administered 2022-08-08: 4 mg via INTRAVENOUS
  Filled 2022-08-08: qty 2

## 2022-08-08 MED ORDER — PHENYLEPHRINE 80 MCG/ML (10ML) SYRINGE FOR IV PUSH (FOR BLOOD PRESSURE SUPPORT)
PREFILLED_SYRINGE | INTRAVENOUS | Status: DC | PRN
  Administered 2022-08-08 (×4): 80 ug via INTRAVENOUS

## 2022-08-08 MED ORDER — 0.9 % SODIUM CHLORIDE (POUR BTL) OPTIME
TOPICAL | Status: DC | PRN
  Administered 2022-08-08: 1000 mL

## 2022-08-08 MED ORDER — PROPOFOL 10 MG/ML IV BOLUS
INTRAVENOUS | Status: DC | PRN
  Administered 2022-08-08: 120 mg via INTRAVENOUS

## 2022-08-08 MED ORDER — OXYCODONE HCL 5 MG PO TABS
ORAL_TABLET | ORAL | Status: AC
  Filled 2022-08-08: qty 1

## 2022-08-08 MED ORDER — LIDOCAINE 2% (20 MG/ML) 5 ML SYRINGE
INTRAMUSCULAR | Status: DC | PRN
  Administered 2022-08-08: 100 mg via INTRAVENOUS

## 2022-08-08 MED ORDER — FENTANYL CITRATE (PF) 100 MCG/2ML IJ SOLN
INTRAMUSCULAR | Status: DC | PRN
  Administered 2022-08-08: 25 ug via INTRAVENOUS
  Administered 2022-08-08: 75 ug via INTRAVENOUS

## 2022-08-08 MED ORDER — FENTANYL CITRATE PF 50 MCG/ML IJ SOSY
25.0000 ug | PREFILLED_SYRINGE | INTRAMUSCULAR | Status: DC | PRN
  Administered 2022-08-08: 50 ug via INTRAVENOUS

## 2022-08-08 SURGICAL SUPPLY — 35 items

## 2022-08-08 NOTE — Anesthesia Postprocedure Evaluation (Signed)
Anesthesia Post Note  Patient: Tora Perches  Procedure(s) Performed: LEFT INFERIOR PARATHYROIDECTOMY, NECK EXPLORATION; BIOPSY RIGHT SUPERIOR PARATHYROID (Left)     Patient location during evaluation: PACU Anesthesia Type: General Level of consciousness: awake and alert Pain management: pain level controlled Vital Signs Assessment: post-procedure vital signs reviewed and stable Respiratory status: spontaneous breathing, nonlabored ventilation, respiratory function stable and patient connected to nasal cannula oxygen Cardiovascular status: blood pressure returned to baseline and stable Postop Assessment: no apparent nausea or vomiting Anesthetic complications: yes   Encounter Notable Events  Notable Event Outcome Phase Comment  Difficult to intubate - unexpected  Intraprocedure Filed from anesthesia note documentation.    Last Vitals:  Vitals:   08/08/22 1400 08/08/22 1437  BP: (!) 159/87 136/65  Pulse: 66 (!) 58  Resp: 14 14  Temp:  (!) 36.3 C  SpO2: 100% 97%    Last Pain:  Vitals:   08/08/22 1609  TempSrc:   PainSc: 10-Worst pain ever                 Trevor Iha

## 2022-08-08 NOTE — Interval H&P Note (Signed)
History and Physical Interval Note:  08/08/2022 9:36 AM  Jillian Powell  has presented today for surgery, with the diagnosis of PRIMARY HYPERPARATHYROIDISM.  The various methods of treatment have been discussed with the patient and family. After consideration of risks, benefits and other options for treatment, the patient has consented to    Procedure(s): LEFT INFERIOR PARATHYROIDECTOMY, POSSIBLE NECK EXPLORATION (Left) as a surgical intervention.    The patient's history has been reviewed, patient examined, no change in status, stable for surgery.  I have reviewed the patient's chart and labs.  Questions were answered to the patient's satisfaction.    Darnell Level, MD [facility] practice Office: [phone]   Darnell Level

## 2022-08-08 NOTE — Anesthesia Procedure Notes (Signed)
Procedure Name: Intubation Date/Time: 08/08/2022 10:13 AM  Performed by: Donna Bernard, CRNAPre-anesthesia Checklist: Patient identified, Emergency Drugs available, Suction available, Patient being monitored and Timeout performed Patient Re-evaluated:Patient Re-evaluated prior to induction Oxygen Delivery Method: Circle system utilized Preoxygenation: Pre-oxygenation with 100% oxygen Induction Type: IV induction Ventilation: Mask ventilation without difficulty Laryngoscope Size: Mac Grade View: Grade III Tube type: Oral Tube size: 7.0 mm Number of attempts: 1 Airway Equipment and Method: Stylet and Video-laryngoscopy Placement Confirmation: positive ETCO2, ETT inserted through vocal cords under direct vision, CO2 detector and breath sounds checked- equal and bilateral Secured at: 22 cm Tube secured with: Tape Dental Injury: Teeth and Oropharynx as per pre-operative assessment  Difficulty Due To: Difficulty was unanticipated and Difficult Airway- due to anterior larynx Comments: Easy mask airway. Anterior trachea.  Glidescope needed.  Uvula was bleeding but subsiding as we rechecked oropharynx after intubation.

## 2022-08-08 NOTE — Op Note (Signed)
Operative Note  Pre-operative Diagnosis:  primary hyperparathyroidism  Post-operative Diagnosis:  same  Surgeon:  Darnell Level, MD  Assistant:  Saunders Glance, PA-C   Procedure:  Neck exploration, left inferior parathyroidectomy, right superior parathyroid biopsy  Anesthesia:  general   Estimated Blood Loss:  20 cc  Drains: none         Specimen: left inferior parathyroid to pathology, right superior biopsy to pathology  Indications:  Patient returns in order to discuss results of multiple imaging studies and repeat laboratory studies. Patient had previously undergone an ultrasound examination of the neck on February 04, 2021. She did not have any significant thyroid nodules. However there was a 6 x 5 x 5 mm solid nodule adjacent to the inferior tip of the left thyroid lobe which was suspicious for parathyroid adenoma. After her last office visit she underwent a nuclear medicine parathyroid scan on February 24, 2022. This was negative for parathyroid adenoma. Patient then underwent an MRI scan of the neck on April 11, 2022. This also failed to identify evidence of a parathyroid adenoma. We repeated her laboratory studies on April 25, 2022. This showed a persistently elevated calcium of 11.5 and an unsuppressed intact PTH level of 34. Patient returns today to review these results with me in person and to discuss recommendations for management.   Procedure:  The patient was seen in the pre-op holding area. The risks, benefits, complications, treatment options, and expected outcomes were previously discussed with the patient. The patient agreed with the proposed plan and has signed the informed consent form.  The patient was brought to the operating room by the surgical team, identified as Jillian Powell and the procedure verified. A "time out" was completed and the above information confirmed.  Following administration of general endotracheal anesthesia, the patient was positioned and then  prepped and draped in the usual aseptic fashion.  After ascertaining that an adequate level of anesthesia been achieved, a left anterior neck incision is made with a #15 blade.  Dissection was carried through subcutaneous tissues and platysma.  Hemostasis is achieved with the electrocautery.  Subplatysmal flaps are developed circumferentially and a self-retaining retractors placed for exposure.  Strap muscles are incised in the midline and reflected towards the left exposing the left thyroid lobe.  The left lobe appears normal except for a small nodular mass at the inferior pole of the thyroid.  This is gently dissected out.  This appears to be a small neoplasm separate from the remaining normal thyroid parenchyma.  It is gently dissected away from the thyroid lobe.  Vascular tributaries are divided between small ligaclips.  The nodule is excised.  This measures about 6 to 7 mm in diameter consistent with the nodule seen on ultrasound.  It is submitted to pathology where frozen section confirms parathyroid tissue which appears to be hypercellular.  Further dissection on the left side fails to reveal evidence of a left superior adenoma.  At this point we decided to proceed with exploration of the right side.  Cervical incision was extended slightly to the right.  Subplatysmal flaps were extended and a self-retaining retractor was placed.  Strap muscles were then reflected towards the right exposing a normal right thyroid lobe.  Exploration inferiorly failed to reveal any evidence of parathyroid tissue.  Exploration superiorly shows what appears to be a normal superior parathyroid gland.  Biopsy is taken and confirms parathyroid tissue which is mildly hypercellular.  Further exploration includes the thyroid thymic tract on both  sides.  No evidence of ectopic parathyroid tissue is identified.  Additional dissection on both sides back to the precervical fascia fails to reveal any parathyroid tissue.  Repeat  exploration of the left superior location fails to reveal any parathyroid tissue.  Findings on frozen section are discussed with pathology.  A decision is made to discontinue further exploration and await results of follow-up calcium and PTH levels in a few weeks.  Neck is irrigated with warm saline.  Fibrillar was placed throughout the operative field.  Strap muscles are reapproximated in the midline of interrupted 3-0 Vicryl sutures.  Platysma is closed with interrupted 3-0 Vicryl suture.  Skin is closed with a running 4-0 Monocryl subcuticular suture.  Wound was washed and dried and Dermabond is applied as dressing.  Patient is awakened from anesthesia and transported to the recovery room in stable condition.  The patient tolerated the procedure well.   Darnell Level, MD [facility] Surgery Office: [phone]

## 2022-08-08 NOTE — Transfer of Care (Signed)
Immediate Anesthesia Transfer of Care Note  Patient: Tora Perches  Procedure(s) Performed: LEFT INFERIOR PARATHYROIDECTOMY, NECK EXPLORATION; BIOPSY RIGHT SUPERIOR PARATHYROID (Left)  Patient Location: PACU  Anesthesia Type:General  Level of Consciousness: sedated, drowsy, and patient cooperative  Airway & Oxygen Therapy: Patient connected to face mask oxygen  Post-op Assessment: Report given to RN and Post -op Vital signs reviewed and stable  Post vital signs: stable  Last Vitals:  Vitals Value Taken Time  BP 164/88 08/08/22 1215  Temp    Pulse 77 08/08/22 1215  Resp 14 08/08/22 1215  SpO2 100 % 08/08/22 1215  Vitals shown include unvalidated device data.  Last Pain:  Vitals:   08/08/22 0805  TempSrc:   PainSc: 0-No pain         Complications:  Encounter Notable Events  Notable Event Outcome Phase Comment  Difficult to intubate - unexpected  Intraprocedure Filed from anesthesia note documentation.

## 2022-08-09 ENCOUNTER — Encounter (HOSPITAL_COMMUNITY): Payer: Self-pay | Admitting: Surgery

## 2022-08-09 DIAGNOSIS — E210 Primary hyperparathyroidism: Secondary | ICD-10-CM | POA: Diagnosis not present

## 2022-08-09 LAB — GLUCOSE, CAPILLARY: Glucose-Capillary: 123 mg/dL — ABNORMAL HIGH (ref 70–99)

## 2022-08-09 LAB — CALCIUM: Calcium: 8.3 mg/dL — ABNORMAL LOW (ref 8.9–10.3)

## 2022-08-09 LAB — SURGICAL PATHOLOGY

## 2022-08-09 NOTE — Progress Notes (Signed)
Pathology as expected.  tmg  Rheta Hemmelgarn, MD [facility] practice Office: [phone] 

## 2022-08-09 NOTE — Discharge Instructions (Signed)
[facility] - Dr. Terrell Ostrand  THYROID & PARATHYROID SURGERY:  POST-OP INSTRUCTIONS  Always review the instruction sheet provided by the hospital nurse at discharge.  A prescription for pain medication may be sent to your pharmacy at the time of discharge.  Take your pain medication as prescribed.  If narcotic pain medicine is not needed, then you may take acetaminophen (Tylenol) or ibuprofen (Advil) as needed for pain or soreness.  Take your normal home medications as prescribed unless otherwise directed.  If you need a refill on your pain medication, please contact the office during regular business hours.  Prescriptions will not be processed by the office after 5:00PM or on weekends.  Start with a light diet upon arrival home, such as soup and crackers or toast.  Be sure to drink plenty of fluids.  Resume your normal diet the day after surgery.  Most patients will experience some swelling and bruising on the chest and neck area.  Ice packs will help for the first 48 hours after arriving home.  Swelling and bruising will take several days to resolve.   It is common to experience some constipation after surgery.  Increasing fluid intake and taking a stool softener (Colace) will usually help to prevent this problem.  A mild laxative (Milk of Magnesia or Miralax) should be taken according to package directions if there has been no bowel movement after 48 hours.  Dermabond glue covers your incision. This seals the wound and you may shower at any time. The Dermabond will remain in place for about a week.  You may gradually remove the glue when it loosens around the edges.  If you need to loosen the Dermabond for removal, apply a layer of Vaseline to the wound for 15 minutes and then remove with a Kleenex. Your sutures are under the skin and will not show - they will dissolve on their own.  You may resume light daily activities beginning the day after discharge (such as self-care,  walking, climbing stairs), gradually increasing activities as tolerated. You may have sexual intercourse when it is comfortable. Refrain from any heavy lifting or straining until approved by your doctor. You may drive when you no longer are taking prescription pain medication, you can comfortably wear a seatbelt, and you can safely maneuver your car and apply the brakes.  You will see your doctor in the office for a follow-up appointment approximately three weeks after your surgery.  Make sure that you call for this appointment within a day or two after you arrive home to insure a convenient appointment time. Please have any requested laboratory tests performed a few days prior to your office visit so that the results will be available at your follow up appointment.  WHEN TO CALL THE [facility] OFFICE: -- Fever greater than 101.5 -- Inability to urinate -- Nausea and/or vomiting - persistent -- Extreme swelling or bruising -- Continued bleeding from incision -- Increased pain, redness, or drainage from the incision -- Difficulty swallowing or breathing -- Muscle cramping or spasms -- Numbness or tingling in hands or around lips  The clinic staff is available to answer your questions during regular business hours.  Please don't hesitate to call and ask to speak to one of the nurses if you have concerns.  [facility] OFFICE: [phone] (24 hours)  Please sign up for MyChart accounts. This will allow you to communicate directly with my nurse or myself without having to call the office. It will also allow you   to view your test results. You will need to enroll in MyChart for my office ([facility]) and for the hospital ([place]).  Jillian Sensabaugh, MD [facility] A [facility] practice 

## 2022-08-09 NOTE — Progress Notes (Signed)
Mobility Specialist - Progress Note   08/09/22 1000  Mobility  Activity Ambulated with assistance in hallway  Level of Assistance Contact guard assist, steadying assist  Assistive Device None  Distance Ambulated (ft) 500 ft  Range of Motion/Exercises Active  Activity Response Tolerated well  Mobility Referral Yes  $Mobility charge 1 Mobility  Mobility Specialist Start Time (ACUTE ONLY) 0950  Mobility Specialist Stop Time (ACUTE ONLY) 1001  Mobility Specialist Time Calculation (min) (ACUTE ONLY) 11 min   Pt received in bed and agreed to mobility. Pt stood up at bedside and sat back down quickly as she got unsteady.   Stood up once more and continued session with no issues, returned to bed with all needs met, family in room.  Marilynne Halsted Mobility Specialist

## 2022-08-09 NOTE — TOC CM/SW Note (Signed)
Transition of Care [facility]) - Inpatient Brief Assessment   Patient Details  Name: Jillian Powell MRN: [id number] Date of Birth: June 30, 1950  Transition of Care [facility]) CM/SW Contact:    Otelia Santee, LCSW Phone Number: 08/09/2022, 10:29 AM   Clinical Narrative: Pt to return home. No TOC needs identified.    Transition of Care Asessment: Insurance and Status: Insurance coverage has been reviewed Patient has primary care physician: Yes Home environment has been reviewed: Single family home Prior level of function:: Independent Prior/Current Home Services: No current home services Social Determinants of Health Reivew: SDOH reviewed no interventions necessary Readmission risk has been reviewed: No (NA) Transition of care needs: no transition of care needs at this time

## 2022-08-09 NOTE — Discharge Summary (Signed)
Physician Discharge Summary   Patient ID: Jillian Powell MRN: [id number] DOB/AGE: [birth year] 72 y.o.  Admit date: 08/08/2022  Discharge date: 08/09/2022  Discharge Diagnoses:  Principal Problem:   Hyperparathyroidism [facility]) Active Problems:   Primary hyperparathyroidism [facility])   Discharged Condition: good  Hospital Course: Patient was admitted for observation following parathyroid surgery.  Post op course was uncomplicated.  Pain was well controlled.  Tolerated diet.  Post op calcium level on morning following surgery was 8.3 mg/dl.  Patient was prepared for discharge home on POD#1.  Consults: None  Treatments: surgery: neck exploration and parathyroidectomy  Discharge Exam: Blood pressure (!) 141/66, pulse 78, temperature 97.6 F (36.4 C), resp. rate 18, height 5' 4.75" (1.645 m), weight 69 kg, SpO2 98 %. HEENT - clear Neck - wound dry and intact; mild STS; voice normal  Disposition: Home  Discharge Instructions     Diet - low sodium heart healthy   Complete by: As directed    Increase activity slowly   Complete by: As directed    No dressing needed   Complete by: As directed       Allergies as of 08/09/2022       Reactions   Shellfish Allergy Anaphylaxis   Shrimp Extract Itching   Penicillins Hives, Itching, Rash   Sulfa Antibiotics Hives, Itching, Rash        Medication List     TAKE these medications    Aspercreme Lidocaine 4 % Crea Generic drug: Lidocaine HCl Apply 1 Application topically daily as needed (pain).   benzonatate 200 MG capsule Commonly known as: TESSALON Take 200 mg by mouth 3 (three) times daily as needed for cough.   Cholecalciferol 25 MCG (1000 UT) tablet Take 1,000 Units by mouth daily.   ciclopirox 8 % solution Commonly known as: PENLAC Apply topically at bedtime. Apply over nail and surrounding skin. Apply daily over previous coat. Remove weekly with polish remover.   GINKGO BILOBA PO Take 1 tablet by mouth daily.    hydrochlorothiazide 12.5 MG tablet Commonly known as: HYDRODIURIL Take 1 tablet (12.5 mg total) by mouth daily.   ibuprofen 200 MG tablet Commonly known as: ADVIL Take 400-600 mg by mouth 2 (two) times daily as needed for headache or moderate pain.   Lotrel 5-10 MG capsule Generic drug: amLODipine-benazepril Take 1 capsule by mouth daily.   Systane 0.4-0.3 % Soln Generic drug: Polyethyl Glycol-Propyl Glycol Place 1 drop into both eyes at bedtime.   TGT Blood Glucose Monitoring w/Device Kit Use 1 unit as directed once a day  to check blood sugars.   True Metrix Blood Glucose Test test strip Generic drug: glucose blood   TRUEplus Lancets 33G Misc   ZINC PO Take 1 capsule by mouth daily as needed (cold symptoms).               Discharge Care Instructions  (From admission, onward)           Start     Ordered   08/09/22 0000  No dressing needed        08/09/22 1020            Follow-up Information     Darnell Level, MD. Schedule an appointment as soon as possible for a visit in 3 week(s).   Specialty: General Surgery Why: For wound re-check Contact information: [address] [place] [address] [phone]  Darnell Level, MD [facility] Office: [phone]   Signed: Darnell Level 08/09/2022, 10:20 AM

## 2022-09-21 ENCOUNTER — Other Ambulatory Visit: Payer: Self-pay | Admitting: Geriatric Medicine

## 2022-09-21 DIAGNOSIS — Z1231 Encounter for screening mammogram for malignant neoplasm of breast: Secondary | ICD-10-CM

## 2022-09-30 ENCOUNTER — Other Ambulatory Visit: Payer: Self-pay | Admitting: Endocrinology

## 2022-09-30 ENCOUNTER — Other Ambulatory Visit (INDEPENDENT_AMBULATORY_CARE_PROVIDER_SITE_OTHER): Payer: Medicare HMO

## 2022-09-30 DIAGNOSIS — M810 Age-related osteoporosis without current pathological fracture: Secondary | ICD-10-CM | POA: Diagnosis not present

## 2022-09-30 LAB — BASIC METABOLIC PANEL
BUN: 15 mg/dL (ref 6–23)
CO2: 25 mEq/L (ref 19–32)
Calcium: 9.5 mg/dL (ref 8.4–10.5)
Chloride: 105 mEq/L (ref 96–112)
Creatinine, Ser: 0.71 mg/dL (ref 0.40–1.20)
GFR: 85.01 mL/min (ref >60.00)
Glucose, Bld: 222 mg/dL — ABNORMAL HIGH (ref 70–99)
Potassium: 4.1 mEq/L (ref 3.5–5.1)
Sodium: 138 mEq/L (ref 135–145)

## 2022-09-30 LAB — VITAMIN D 25 HYDROXY (VIT D DEFICIENCY, FRACTURES): VITD: 44.1 ng/mL (ref 30.00–100.00)

## 2022-10-04 ENCOUNTER — Ambulatory Visit: Payer: Medicare HMO | Admitting: Endocrinology

## 2022-10-04 ENCOUNTER — Encounter: Payer: Self-pay | Admitting: Endocrinology

## 2022-10-04 ENCOUNTER — Telehealth: Payer: Self-pay

## 2022-10-04 VITALS — BP 138/70 | HR 76 | Ht 63.75 in | Wt 157.6 lb

## 2022-10-04 DIAGNOSIS — E1165 Type 2 diabetes mellitus with hyperglycemia: Secondary | ICD-10-CM

## 2022-10-04 DIAGNOSIS — Z86018 Personal history of other benign neoplasm: Secondary | ICD-10-CM | POA: Diagnosis not present

## 2022-10-04 DIAGNOSIS — M810 Age-related osteoporosis without current pathological fracture: Secondary | ICD-10-CM

## 2022-10-04 DIAGNOSIS — Z7984 Long term (current) use of oral hypoglycemic drugs: Secondary | ICD-10-CM | POA: Diagnosis not present

## 2022-10-04 MED ORDER — EMPAGLIFLOZIN 10 MG PO TABS: 10.0000 mg | ORAL_TABLET | Freq: Every day | ORAL | 3 refills | Status: DC

## 2022-10-04 NOTE — Progress Notes (Signed)
Patient ID: Jillian Powell, female   DOB: 06/26/1950, 72 y.o.   MRN: [id number]          Chief complaint: Endocrinology follow-up  History of Present Illness:   HYPERCALCEMIA/hyperparathyroidism:  History indicates she had a high calcium since 01/25/15 Previously highest calcium 11.4 Highest PTH level previously 46  Also has hypercalciuria  Recent history:  Because of persistent hypercalcemia and kidney stones she was referred for parathyroidectomy Her parathyroids were not visualized on the MRI and previous studies had not confirmed the parathyroid adenoma However neck exploration showed left inferior parathyroid adenoma which she had removed in 08/2022  Calcium has been subsequently normal  Lab Results  Component Value Date   CALCIUM 9.5 09/30/2022   CALCIUM 8.3 (L) 08/09/2022   CALCIUM 10.3 07/10/2022   CALCIUM 10.6 (H) 09/23/2021   CALCIUM 10.8 (H) 05/28/2021   CALCIUM 10.4 03/03/2021   CALCIUM 11.0 (H) 03/29/2019   CALCIUM 11.1 (H) 04/24/2018   CALCIUM 11.1 (H) 10/10/2017   .  She did have kidney stones in 2015 but no recurrence She has had high 24 urine calcium  PTH is normal at 21  Prior serologic and radiologic studies have included:   PTH levels: In 09/2015 = 46 and in 11/17 = 24  01/26/2016: 24 urine calcium = 526.4 (H)   25 (OH) Vitamin D level, last was 23.2 and previous 1, 25 hydroxy vitamin D was 43.7   Parathyroid scan in done in 03/2016: Mild activity corresponding to 5 x 5 x 3 mm nodular density posterior to the upper pole of the right thyroid lobe. These findings suggest the possibility of small parathyroid adenoma at this site.   THYROID ultrasound from 12/22 shows a 6 mm nodule adjacent to the left thyroid lobe but no nodule on the right side  OSTEOPOROSIS:  She had lost 1-1/2 inches since her youth, previous height about 5 feet 5 inches No history of fracture Height is about the same today  BONE density results:    Lumbar spine L1-L4  Femoral neck (FN) 33% distal radius  T-scores 2019 -2.6 RFN: -2.3 LFN: -2.4 n/a  T-scores 06/2020 -2.9 -2.9 LFN Total femur = -2.8 n/a   Previous bone density in 2017 showed spine T score -2.5  Prior treatment has included boniva for 3 years, not clear of the starting and stopping dates, probably stopped in 2017 In 2019 she was given risedronate 150 mg monthly but she stopped this because of high cost  Subsequently was recommended alendronate but she did not take it   She did not want to take Reclast and was able to start Prolia after prior authorization She had her first injection on 05/28/2021 More recently after the injection she has not had any bone pain Last injection was in 05/2022  Also has been taking 2000 units of vitamin D3 since her baseline level was low Follow-up vitamin D levels:  Lab Results  Component Value Date   VD25OH 44.10 09/30/2022   VD25OH 33.78 05/28/2021       Allergies as of 10/04/2022       Reactions   Shellfish Allergy Anaphylaxis   Shrimp Extract Itching   Penicillins Hives, Itching, Rash   Sulfa Antibiotics Hives, Itching, Rash        Medication List        Accurate as of October 04, 2022  9:37 AM. If you have any questions, ask your nurse or doctor.  Aspercreme Lidocaine 4 % Crea Generic drug: Lidocaine HCl Apply 1 Application topically daily as needed (pain).   benzonatate 200 MG capsule Commonly known as: TESSALON Take 200 mg by mouth 3 (three) times daily as needed for cough.   Cholecalciferol 25 MCG (1000 UT) tablet Take 1,000 Units by mouth daily.   ciclopirox 8 % solution Commonly known as: PENLAC Apply topically at bedtime. Apply over nail and surrounding skin. Apply daily over previous coat. Remove weekly with polish remover.   GINKGO BILOBA PO Take 1 tablet by mouth daily.   hydrochlorothiazide 12.5 MG tablet Commonly known as: HYDRODIURIL Take 1 tablet (12.5 mg total) by mouth daily.   ibuprofen 200 MG  tablet Commonly known as: ADVIL Take 400-600 mg by mouth 2 (two) times daily as needed for headache or moderate pain.   Lotrel 5-10 MG capsule Generic drug: amLODipine-benazepril Take 1 capsule by mouth daily.   Systane 0.4-0.3 % Soln Generic drug: Polyethyl Glycol-Propyl Glycol Place 1 drop into both eyes at bedtime.   TGT Blood Glucose Monitoring w/Device Kit Use 1 unit as directed once a day  to check blood sugars.   True Metrix Blood Glucose Test test strip Generic drug: glucose blood   TRUEplus Lancets 33G Misc   ZINC PO Take 1 capsule by mouth daily as needed (cold symptoms).        Allergies:  Allergies  Allergen Reactions   Shellfish Allergy Anaphylaxis   Shrimp Extract Itching   Penicillins Hives, Itching and Rash   Sulfa Antibiotics Hives, Itching and Rash    Past Medical History:  Diagnosis Date   Arthritis    Diabetes mellitus without complication (HCC)    Elevated liver enzymes    GERD (gastroesophageal reflux disease)    History of kidney stones    Hypertension    Pre-diabetes     Past Surgical History:  Procedure Laterality Date   EYE SURGERY Left    PARATHYROIDECTOMY Left 08/08/2022   Procedure: LEFT INFERIOR PARATHYROIDECTOMY, NECK EXPLORATION; BIOPSY RIGHT SUPERIOR PARATHYROID;  Surgeon: Darnell Level, MD;  Location: [facility];  Service: General;  Laterality: Left;   TUBAL LIGATION      Family History  Problem Relation Age of Onset   Diabetes Mother    CAD Mother    Other Father        old age died at 73   Thyroid disease Neg Hx    Breast cancer Neg Hx    Colon cancer Neg Hx    Liver cancer Neg Hx     Social History:  reports that she has never smoked. She has never used smokeless tobacco. She reports that she does not drink alcohol and does not use drugs.  Review of Systems  DIABETES:  She was diagnosed to have diabetes with an A1c of 7% in March 2019 She has been reluctant to take metformin Previously  she has seen the  dietitian in 2019  She does check her blood sugars periodically at home and they are at the most 130 7 in the morning but not checking after meals  Random glucose 222 on her labs now  Recent weight appears to be somewhat higher She does not think that she is always consistent with watching her diet She has been previously managed by her PCP for following the diabetes  Wt Readings from Last 3 Encounters:  10/04/22 157 lb 9.6 oz (71.5 kg)  08/08/22 152 lb 1.9 oz (69 kg)  08/05/22 153 lb (69.4 kg)  Lab Results  Component Value Date   HGBA1C 6.7 (H) 08/05/2022   HGBA1C 6.8 (H) 03/03/2021   HGBA1C 6.4 04/24/2018   Lab Results  Component Value Date   CREATININE 0.71 09/30/2022   Home Bp usually in the 120s, not checking regularly  BP Readings from Last 3 Encounters:  10/04/22 138/70  08/09/22 (!) 141/66  08/05/22 (!) 142/80     EXAM:  BP 138/70   Pulse 76   Ht 5' 3.75" (1.619 m)   Wt 157 lb 9.6 oz (71.5 kg)   SpO2 95%   BMI 27.26 kg/m     Assessment/Plan:   HYPERPARATHYROIDISM:  She has mild persistent hypercalcemia since at least 2016 with levels around 11 This was confirmed with inappropriately high PTH at baseline and 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 previously However she had neck exploration because of negative MRI of her neck and was found to had a left inferior parathyroid adenoma in June 2024 Subsequent calcium has been normal  OSTEOPOROSIS: T score is -2.9 at the last measurement in 06/2020  Has had her last Prolia in 3/24  She will come back in 9/24 for follow-up injection Plan to recheck bone density in late 2024  VITAMIN D deficiency: Adequately replaced and she will continue OTC supplement  DIABETES: Her last A1c was below 7 but her glucose was over 200 postprandial Also gaining weight Discussed that long-term she may benefit from an SGLT2 drug given her history of hypertension and benefits on cardiovascular  risk reduction She was given a sample of Jardiance 10 mg daily to start  Discussed action of SGLT 2 drugs on lowering glucose by decreasing kidney absorption of glucose, benefits of weight loss and lower blood pressure, possible side effects including candidiasis and dosage regimen  She will also check her blood pressure regularly Recommended that she stop her Lotrel for couple of days while starting Jardiance to see if her blood pressure stays normal with this alone She will also need to monitor her blood sugars more regularly after meals  There are no Patient Instructions on file for this visit.    Reather Littler 10/04/2022, 9:37 AM

## 2022-10-04 NOTE — Telephone Encounter (Signed)
Sample  Medication: Jardiance  Dosage: 10 mg  Quantity: 1 box  LOT: [id number] EXP: 03/2024

## 2022-10-04 NOTE — Patient Instructions (Signed)
Hydration and check BP with Jardiance

## 2022-10-17 NOTE — Telephone Encounter (Signed)
Prolia VOB initiated via [email or web address]  Next Prolia inj DUE: 12/03/22

## 2022-10-18 ENCOUNTER — Ambulatory Visit: Payer: Medicare HMO | Admitting: Podiatry

## 2022-10-18 DIAGNOSIS — B351 Tinea unguium: Secondary | ICD-10-CM | POA: Diagnosis not present

## 2022-10-18 NOTE — Progress Notes (Unsigned)
    Subjective:  Patient ID: Jillian Powell, female    DOB: 1951-02-04,  MRN: [id number]  Jillian Powell presents to clinic today for:  Chief Complaint  Patient presents with   Nail Problem    COMPLETED TREATMENT TODAY LEFT 2ND TOE, STATES SHE THINKS IT HAS GOTTEN BETTER  . Patient notes nails are thick, discolored, elongated and painful in shoegear when trying to ambulate.    PCP is Combs, Prince Solian, DO.  Allergies  Allergen Reactions   Shellfish Allergy Anaphylaxis   Shrimp Extract Itching   Penicillins Hives, Itching and Rash   Sulfa Antibiotics Hives, Itching and Rash    Review of Systems: Negative except as noted in the HPI.  Objective:  There were no vitals filed for this visit.  Jillian Powell is a pleasant 72 y.o. female in NAD. AAO x 3.  Vascular Examination: Capillary refill time is 3-5 seconds to toes bilateral. Palpable pedal pulses b/l LE. Digital hair present b/l. No pedal edema b/l. Skin temperature gradient WNL b/l. No varicosities b/l. No cyanosis or clubbing noted b/l.   Dermatological Examination: The left second toenail is 3 mm thick with yellow discoloration, subungual debris, distal onycholysis, and pain with compression.  There is no surrounding erythema.  There does appear to be some proximal clearing.  There is some ciclopirox lacquer buildup on the nail making it difficult to evaluate.     Latest Ref Rng & Units 08/05/2022   10:31 AM  Hemoglobin A1C  Hemoglobin-A1c 4.8 - 5.6 % 6.7     Assessment/Plan: 1. Dermatophytosis of nail     The mycotic toenails was sharply debrided on the left second toenail with sterile nail nippers and a power debriding burr to decrease bulk/thickness and length.  Upon removal of some of the lacquer on the second toenail it does appear to be clearing along the proximal 20% of the nail.  Continue with the ciclopirox 8% solution topically to the nail once daily.  Return in about 4 months (around  02/17/2023) for fungal nail recheck.   Clerance Lav, DPM, FACFAS [facility]     [address] [place], [place] [address]                Office [phone]  Fax [phone]

## 2022-10-22 NOTE — Telephone Encounter (Signed)
Prior Auth required for [employer]  PA PROCESS DETAILS: PA is required. PA can be initiated by calling [phone] or online at [email or web address]

## 2022-10-22 NOTE — Telephone Encounter (Signed)
Pt ready for scheduling on or after 12/03/22  Out-of-pocket cost due at time of visit: $345  Primary: [employer] Adv Gold Plus Prolia co-insurance: 20% (approximately $320) Admin fee co-insurance: 20% (approximately $25)  Deductible: does not apply  Prior Auth: APPROVED PA Case ID #: [id number] Valid: 03/31/21-03/07/23  Secondary: N/A Prolia co-insurance:  Admin fee co-insurance:  Deductible:  Prior Auth:  PA# Valid:   ** This summary of benefits is an estimation of the patient's out-of-pocket cost. Exact cost may vary based on individual plan coverage.

## 2022-10-22 NOTE — Telephone Encounter (Signed)
Prior Auth: APPROVED PA Case ID #: [id number] Valid: 03/31/21-03/07/23

## 2022-10-25 ENCOUNTER — Ambulatory Visit: Payer: Medicare HMO | Admitting: Podiatry

## 2022-10-31 ENCOUNTER — Ambulatory Visit
Admission: RE | Admit: 2022-10-31 | Discharge: 2022-10-31 | Disposition: A | Discharge status: Home / Self Care | Payer: Medicare HMO | Source: Ambulatory Visit | Attending: Geriatric Medicine | Admitting: Geriatric Medicine

## 2022-10-31 ENCOUNTER — Ambulatory Visit: Payer: Medicare HMO

## 2022-10-31 DIAGNOSIS — Z1231 Encounter for screening mammogram for malignant neoplasm of breast: Secondary | ICD-10-CM

## 2022-11-03 ENCOUNTER — Other Ambulatory Visit: Payer: Self-pay | Admitting: Geriatric Medicine

## 2022-11-03 DIAGNOSIS — R928 Other abnormal and inconclusive findings on diagnostic imaging of breast: Secondary | ICD-10-CM

## 2022-11-04 ENCOUNTER — Ambulatory Visit
Admission: RE | Admit: 2022-11-04 | Discharge: 2022-11-04 | Disposition: A | Discharge status: Home / Self Care | Payer: Medicare HMO | Source: Ambulatory Visit | Attending: Geriatric Medicine | Admitting: Geriatric Medicine

## 2022-11-04 ENCOUNTER — Ambulatory Visit: Admission: RE | Admit: 2022-11-04 | Payer: Medicare HMO | Source: Ambulatory Visit

## 2022-11-04 DIAGNOSIS — R928 Other abnormal and inconclusive findings on diagnostic imaging of breast: Secondary | ICD-10-CM

## 2022-11-15 ENCOUNTER — Other Ambulatory Visit: Payer: Medicare HMO

## 2022-12-01 ENCOUNTER — Other Ambulatory Visit (INDEPENDENT_AMBULATORY_CARE_PROVIDER_SITE_OTHER): Payer: Medicare HMO

## 2022-12-01 DIAGNOSIS — E1165 Type 2 diabetes mellitus with hyperglycemia: Secondary | ICD-10-CM

## 2022-12-02 LAB — BASIC METABOLIC PANEL
BUN: 20 mg/dL (ref 6–23)
CO2: 24 meq/L (ref 19–32)
Calcium: 9.9 mg/dL (ref 8.4–10.5)
Chloride: 105 meq/L (ref 96–112)
Creatinine, Ser: 0.83 mg/dL (ref 0.40–1.20)
GFR: 70.4 mL/min (ref >60.00)
Glucose, Bld: 178 mg/dL — ABNORMAL HIGH (ref 70–99)
Potassium: 4.1 meq/L (ref 3.5–5.1)
Sodium: 139 meq/L (ref 135–145)

## 2022-12-02 LAB — HEMOGLOBIN A1C: Hgb A1c MFr Bld: 7.6 % — ABNORMAL HIGH (ref 4.6–6.5)

## 2022-12-05 ENCOUNTER — Ambulatory Visit: Payer: Medicare HMO | Admitting: Endocrinology

## 2022-12-06 ENCOUNTER — Ambulatory Visit: Payer: Medicare HMO

## 2023-01-26 ENCOUNTER — Ambulatory Visit: Payer: Medicare HMO | Admitting: Endocrinology

## 2023-01-26 ENCOUNTER — Encounter: Payer: Self-pay | Admitting: Endocrinology

## 2023-01-26 VITALS — BP 136/80 | HR 79 | Resp 20 | Ht 63.5 in | Wt 155.6 lb

## 2023-01-26 DIAGNOSIS — E210 Primary hyperparathyroidism: Secondary | ICD-10-CM | POA: Diagnosis not present

## 2023-01-26 DIAGNOSIS — M810 Age-related osteoporosis without current pathological fracture: Secondary | ICD-10-CM

## 2023-01-26 DIAGNOSIS — E1165 Type 2 diabetes mellitus with hyperglycemia: Secondary | ICD-10-CM

## 2023-01-26 DIAGNOSIS — Z7984 Long term (current) use of oral hypoglycemic drugs: Secondary | ICD-10-CM | POA: Diagnosis not present

## 2023-01-26 DIAGNOSIS — Z9889 Other specified postprocedural states: Secondary | ICD-10-CM | POA: Diagnosis not present

## 2023-01-26 MED ORDER — EMPAGLIFLOZIN 10 MG PO TABS: 10.0000 mg | ORAL_TABLET | Freq: Every day | ORAL | 3 refills | Status: DC

## 2023-01-26 MED ORDER — BLOOD GLUCOSE TEST VI STRP: ORAL_STRIP | 3 refills | Status: AC

## 2023-01-26 MED ORDER — LANCET DEVICE MISC: 1.0000 | Freq: Three times a day (TID) | 0 refills | Status: AC

## 2023-01-26 MED ORDER — LANCETS MISC. MISC: 1.0000 | Freq: Three times a day (TID) | 3 refills | Status: AC

## 2023-01-26 MED ORDER — DENOSUMAB 60 MG/ML ~~LOC~~ SOSY
60.0000 mg | PREFILLED_SYRINGE | Freq: Once | SUBCUTANEOUS | Status: AC
Start: 2023-07-26 — End: 2023-07-28
  Administered 2023-07-28: 60 mg via SUBCUTANEOUS

## 2023-01-26 MED ORDER — BLOOD GLUCOSE MONITORING SUPPL DEVI: 1.0000 | Freq: Three times a day (TID) | 0 refills | Status: AC

## 2023-01-26 MED ORDER — DENOSUMAB 60 MG/ML ~~LOC~~ SOSY
60.0000 mg | PREFILLED_SYRINGE | Freq: Once | SUBCUTANEOUS | Status: AC
Start: 2023-01-26 — End: 2023-01-26
  Administered 2023-01-26: 60 mg via SUBCUTANEOUS

## 2023-01-26 NOTE — Progress Notes (Signed)
Outpatient Endocrinology Note Jillian Elma Shands, MD  01/26/23  Patient's Name: Jillian Powell    DOB: Aug 02, 1950    MRN: [id number]                                                    REASON OF VISIT: Follow up for type 2 diabetes mellitus  PCP: Theodis Shove, DO  HISTORY OF PRESENT ILLNESS:   Jillian Powell is a 72 y.o. old female with past medical history listed below, is here for follow up for type 2 diabetes mellitus , primary hyperparathyroidism status post parathyroidectomy, osteoporosis.  Patient was previously seen by Dr. Lucianne Muss in July 2024.  Pertinent Diabetes History: Patient was diagnosed with type 2 diabetes mellitus in March 2019 with hemoglobin A1c of 7%.  She had seen dietitian at time time and did not like to be on metformin.  Diabetes was previously managed by primary care provider however in July 2024, she was evaluated by Dr. Lucianne Muss in this endocrinology clinic and is started on Jardiance.  No personal history of pancreatitis and / or family history of medullary thyroid carcinoma or MEN 2B syndrome.   Chronic Diabetes Complications : Retinopathy: no. Last ophthalmology exam was done on annually., following with ophthalmology regularly.  Nephropathy: no Peripheral neuropathy: no Coronary artery disease: no Stroke: no  Relevant comorbidities and cardiovascular risk factors: Obesity: no Body mass index is 27.13 kg/m.  Hypertension: no  Hyperlipidemia : no, not on statin   Current / Home Diabetic regimen includes: Jardiance 10 mg daily.  Prior diabetic medications:  Glycemic data:   No glucometer data to review.  Hypoglycemia: Patient has no hypoglycemic episodes. Patient has hypoglycemia awareness.  Factors modifying glucose control: 1.  Diabetic diet assessment: 3 meals a day.  2.  Staying active or exercising:   3.  Medication compliance: compliant most of the time.  # Osteoporosis : -No history of fracture.  She has mild height  loss. BONE density results: Consistent with osteoporosis.  Last DEXA scan in April 2022.     Lumbar spine L1-L4 Femoral neck (FN) 33% distal radius  T-scores 2019 -2.6 RFN: -2.3 LFN: -2.4 n/a  T-scores 06/2020 -2.9 -2.9 LFN Total femur = -2.8 n/a    Previous bone density in 2017 showed spine T score -2.5  Prior treatment has included boniva for 3 years, not clear of the starting and stopping dates, probably stopped in 2017. In 2019 she was given risedronate 150 mg monthly but she stopped this because of high cost. Subsequently was recommended alendronate but she did not take it. She did not want to take Reclast and was able to start Prolia after prior authorization.  - She had her first injection of Prolia 60 mg on 05/28/2021, continued every 6 months.  Last injection was in November 2024.   Also has been taking 2000 units of vitamin D3 since her baseline level was low, with normalization of serum vitamin D level.  # Primary hyperparathyroidism/hypercalcemia -She had high calcium since November 2016, previous highest serum calcium was 11.4.  Highest PTH level was previously 46.  She had high calcium 526 in the urine in the past.  Because of persistent hypercalcemia, kidney stone and osteoporosis, she was referred for parathyroidectomy.  Parathyroids were not visualized on MRI and previous histories had not come from  the parathyroid adenoma.  However next exploration showed left inferior parathyroid adenoma which was removed in June 2024 by Dr. Ricky Stabs.  Left inferior and right superior parathyroidectomy pathology was consistent with hypercellular parathyroid tissue 286 mg ( left).  Patient has normalization of serum calcium after surgery.   Interval history  Patient had hemoglobin A1c of 7.6% in September.  She reports she has not been fully compliant with the diet.  She denies any complaints related with Jardiance.  She is currently taking Jardiance 10 mg daily.  Not sure about full compliance  at this time.  Discussed about follow-up for diabetes melitis with primary care versus in this clinic.  She wants to follow-up with this endocrinology clinic.  Patient had parathyroidectomy in June 2024.  She has normalization of serum calcium.  She had normal serum calcium in September.  She is currently taking vitamin D supplement.  She does not take calcium supplement.  She eats fair amount of cheese and  milk daily.  No fall and fracture.  She had Prolia injection in the clinic today.  No other complaints today.  REVIEW OF SYSTEMS As per history of present illness.   PAST MEDICAL HISTORY: Past Medical History:  Diagnosis Date   Arthritis    Diabetes mellitus without complication (HCC)    Elevated liver enzymes    GERD (gastroesophageal reflux disease)    History of kidney stones    Hypertension    Pre-diabetes     PAST SURGICAL HISTORY: Past Surgical History:  Procedure Laterality Date   EYE SURGERY Left    PARATHYROIDECTOMY Left 08/08/2022   Procedure: LEFT INFERIOR PARATHYROIDECTOMY, NECK EXPLORATION; BIOPSY RIGHT SUPERIOR PARATHYROID;  Surgeon: Darnell Level, MD;  Location: [facility];  Service: General;  Laterality: Left;   TUBAL LIGATION      ALLERGIES: Allergies  Allergen Reactions   Shellfish Allergy Anaphylaxis   Shrimp Extract Itching   Penicillins Hives, Itching and Rash   Sulfa Antibiotics Hives, Itching and Rash    FAMILY HISTORY:  Family History  Problem Relation Age of Onset   Diabetes Mother    CAD Mother    Other Father        old age died at 27   Thyroid disease Neg Hx    Breast cancer Neg Hx    Colon cancer Neg Hx    Liver cancer Neg Hx     SOCIAL HISTORY: Social History   Socioeconomic History   Marital status: Married    Spouse name: Not on file   Number of children: 4   Years of education: Not on file   Highest education level: Not on file  Occupational History   Occupation: retired  Tobacco Use   Smoking status: Never   Smokeless  tobacco: Never  Vaping Use   Vaping status: Never Used  Substance and Sexual Activity   Alcohol use: No   Drug use: No   Sexual activity: Not Currently  Other Topics Concern   Not on file  Social History Narrative   Not on file   Social Determinants of Health   Financial Resource Strain: Not on file  Food Insecurity: No Food Insecurity (08/08/2022)   Hunger Vital Sign    Worried About Running Out of Food in the Last Year: Never true    Ran Out of Food in the Last Year: Never true  Transportation Needs: No Transportation Needs (08/08/2022)   PRAPARE - Administrator, Civil Service (Medical): No  Lack of Transportation (Non-Medical): No  Physical Activity: Not on file  Stress: Not on file  Social Connections: Unknown (07/18/2021)   Received from [facility], [facility]   Social Network    Social Network: Not on file    MEDICATIONS:  Current Outpatient Medications  Medication Sig Dispense Refill   Blood Glucose Monitoring Suppl (TGT BLOOD GLUCOSE MONITORING) w/Device KIT Use 1 unit as directed once a day  to check blood sugars.     Blood Glucose Monitoring Suppl DEVI 1 each by Does not apply route in the morning, at noon, and at bedtime. May substitute to any manufacturer covered by patient's insurance. 1 each 0   Cholecalciferol 25 MCG (1000 UT) tablet Take 1,000 Units by mouth daily.     ciclopirox (PENLAC) 8 % solution Apply topically at bedtime. Apply over nail and surrounding skin. Apply daily over previous coat. Remove weekly with polish remover. 6.6 mL 11   GINKGO BILOBA PO Take 1 tablet by mouth daily.     Glucose Blood (BLOOD GLUCOSE TEST STRIPS) STRP Check daily in the morning. May substitute to any manufacturer covered by patient's insurance. 100 each 3   ibuprofen (ADVIL) 200 MG tablet Take 400-600 mg by mouth 2 (two) times daily as needed for headache or moderate pain.     Lancet Device MISC 1 each by Does not apply route in the morning, at noon, and at  bedtime. May substitute to any manufacturer covered by patient's insurance. 1 each 0   Lancets Misc. MISC 1 each by Does not apply route in the morning, at noon, and at bedtime. May substitute to any manufacturer covered by patient's insurance. 100 each 3   Lidocaine HCl (ASPERCREME LIDOCAINE) 4 % CREA Apply 1 Application topically daily as needed (pain).     LOTREL 5-10 MG capsule Take 1 capsule by mouth daily.   1   Multiple Vitamins-Minerals (ZINC PO) Take 1 capsule by mouth daily as needed (cold symptoms).     Polyethyl Glycol-Propyl Glycol (SYSTANE) 0.4-0.3 % SOLN Place 1 drop into both eyes at bedtime.     TRUE METRIX BLOOD GLUCOSE TEST test strip      TRUEplus Lancets 33G MISC      empagliflozin (JARDIANCE) 10 MG TABS tablet Take 1 tablet (10 mg total) by mouth daily with breakfast. 90 tablet 3   Current Facility-Administered Medications  Medication Dose Route Frequency Provider Last Rate Last Admin   [START ON 07/26/2023] denosumab (PROLIA) injection 60 mg  60 mg Subcutaneous Once Bennette Hasty, [place], MD        PHYSICAL EXAM: Vitals:   01/26/23 1525  BP: 136/80  Pulse: 79  Resp: 20  SpO2: 96%  Weight: 155 lb 9.6 oz (70.6 kg)  Height: 5' 3.5" (1.613 m)   Body mass index is 27.13 kg/m.  Wt Readings from Last 3 Encounters:  01/26/23 155 lb 9.6 oz (70.6 kg)  10/04/22 157 lb 9.6 oz (71.5 kg)  08/08/22 152 lb 1.9 oz (69 kg)    General: Well developed, well nourished female in no apparent distress.  HEENT: AT/[place], no external lesions.  Eyes: Conjunctiva clear and no icterus. Neck: Neck supple  Lungs: Respirations not labored Neurologic: Alert, oriented, normal speech Extremities / Skin: Dry. No sores or rashes noted.  No spine tenderness. Psychiatric: Does not appear depressed or anxious  Diabetic Foot Exam - Simple   No data filed     LABS Reviewed Lab Results  Component Value Date  HGBA1C 7.6 (H) 12/01/2022   HGBA1C 6.7 (H) 08/05/2022   HGBA1C 6.8 (H) 03/03/2021   No  results found for: "FRUCTOSAMINE" No results found for: "CHOL", "HDL", "LDLCALC", "LDLDIRECT", "TRIG", "CHOLHDL" No results found for: "MICRALBCREAT" Lab Results  Component Value Date   CREATININE 0.83 12/01/2022   Lab Results  Component Value Date   GFR 70.40 12/01/2022   Parathyroid scan done in 03/2016: Mild activity corresponding to 5 x 5 x 3 mm nodular density posterior to the upper pole of the right thyroid lobe. These findings suggest the possibility of small parathyroid adenoma at this site.    THYROID ultrasound from 12/22 shows a 6 mm nodule adjacent to the left thyroid lobe suspicious for parathyroid adenoma but no nodule on the right side  ASSESSMENT / PLAN  1. Uncontrolled type 2 diabetes mellitus with hyperglycemia, without long-term current use of insulin (HCC)   2. Osteoporosis of lumbar spine   3. Age-related osteoporosis without current pathological fracture   4. Primary hyperparathyroidism (HCC)   5. History of parathyroid surgery     Diabetes Mellitus type 2, complicated by no known complications. - Diabetic status / severity: Uncontrolled.  Lab Results  Component Value Date   HGBA1C 7.6 (H) 12/01/2022    - Hemoglobin A1c goal : <7%  Discussed about increasing dose of Jardiance to 25 mg daily.  Patient wants to stay on 10 mg daily.  She wants to try with diet control and exercise as well.  She does not like to be on metformin.  - Medications: No change.  I) continue Jardiance 10 mg daily.  - Home glucose testing: In the morning fasting daily and occasionally at bedtime. - Discussed/ Gave Hypoglycemia treatment plan.  # Consult : not required at this time.   # Annual urine for microalbuminuria/ creatinine ratio, no microalbuminuria currently, continue ACE/ARB /we will check today. Last No results found for: "MICRALBCREAT"  # Foot check nightly.  # Annual dilated diabetic eye exams.   - Diet: Make healthy diabetic food choices - Life style /  activity / exercise: Discussed.  2. Blood pressure  -  BP Readings from Last 1 Encounters:  01/26/23 136/80    - Control is in target.  - No change in current plans.  3. Lipid status / Hyperlipidemia - Last No results found for: "LDLCALC" -Currently not on statin.  Patient reports she had lab work with primary care provider recently.  Managed by primary care provider.  # Osteoporosis -Continue Prolia 60 mg subcutaneous every 6 months.  She received injection in the clinic today. -Continue current dose of vitamin D3 2000 international unit daily. -Advised to take additional calcium 500 mg over-the-counter 1 tablet daily.  In addition to her dietary calcium intake in the form of cheese and milk. -Discussed about fall precautions. -Will check DEXA scan.  # Primary hyperparathyroidism status post parathyroidectomy in June 2024. -Patient has history of kidney stone.  She has normalization of serum calcium after parathyroid surgery.  Diagnoses and all orders for this visit:  Uncontrolled type 2 diabetes mellitus with hyperglycemia, without long-term current use of insulin (HCC) -     Microalbumin / creatinine urine ratio; Future -     Microalbumin / creatinine urine ratio  Osteoporosis of lumbar spine -     denosumab (PROLIA) injection 60 mg -     denosumab (PROLIA) injection 60 mg  Age-related osteoporosis without current pathological fracture -     DG Bone Density; Future  Primary hyperparathyroidism (HCC)  History of parathyroid surgery  Other orders -     Blood Glucose Monitoring Suppl DEVI; 1 each by Does not apply route in the morning, at noon, and at bedtime. May substitute to any manufacturer covered by patient's insurance. -     Glucose Blood (BLOOD GLUCOSE TEST STRIPS) STRP; Check daily in the morning. May substitute to any manufacturer covered by patient's insurance. -     Lancet Device MISC; 1 each by Does not apply route in the morning, at noon, and at bedtime. May  substitute to any manufacturer covered by patient's insurance. -     Lancets Misc. MISC; 1 each by Does not apply route in the morning, at noon, and at bedtime. May substitute to any manufacturer covered by patient's insurance. -     empagliflozin (JARDIANCE) 10 MG TABS tablet; Take 1 tablet (10 mg total) by mouth daily with breakfast.    DISPOSITION Follow up in clinic in   months suggested.   All questions answered and patient verbalized understanding of the plan.  Jillian Samanatha Brammer, MD [facility] Endocrinology [facility] [address] [place], [address] [place], [place] [address] Phone # [phone]  At least part of this note was generated using voice recognition software. Inadvertent word errors may have occurred, which were not recognized during the proofreading process.

## 2023-01-26 NOTE — Patient Instructions (Signed)
Take calcium 500 mg daily. Continue Vit D.  Plan for DEXA / bone density test.

## 2023-01-27 LAB — MICROALBUMIN / CREATININE URINE RATIO
Creatinine,U: 73.5 mg/dL
Microalb Creat Ratio: 1 mg/g (ref 0.0–30.0)
Microalb, Ur: <0.7 mg/dL (ref 0.0–1.9)

## 2023-01-28 NOTE — Telephone Encounter (Signed)
Last Prolia inj 01/26/23 Next Prolia inj due 07/27/23

## 2023-02-20 ENCOUNTER — Ambulatory Visit: Payer: Medicare HMO | Admitting: Podiatry

## 2023-02-20 ENCOUNTER — Encounter: Payer: Self-pay | Admitting: Podiatry

## 2023-02-20 DIAGNOSIS — B351 Tinea unguium: Secondary | ICD-10-CM | POA: Diagnosis not present

## 2023-02-20 NOTE — Progress Notes (Signed)
       Chief Complaint  Patient presents with   Nail Problem    She reports she is having the same problem with the place on the 2nd toe on both feet. She has a lot of pain and black spots .    HPI: 72 y.o. female presents today for follow-up of fungal involvement to the bilateral second toenails.  She has been using the ciclopirox lacquer on the nail.  She feels the nails look worse.  She notes black discoloration of the nails.  She is concerned today.  Past Medical History:  Diagnosis Date   Arthritis    Diabetes mellitus without complication (HCC)    Elevated liver enzymes    GERD (gastroesophageal reflux disease)    History of kidney stones    Hypertension    Pre-diabetes     Past Surgical History:  Procedure Laterality Date   EYE SURGERY Left    PARATHYROIDECTOMY Left 08/08/2022   Procedure: LEFT INFERIOR PARATHYROIDECTOMY, NECK EXPLORATION; BIOPSY RIGHT SUPERIOR PARATHYROID;  Surgeon: Darnell Level, MD;  Location: [facility];  Service: General;  Laterality: Left;   TUBAL LIGATION      Allergies  Allergen Reactions   Shellfish Allergy Anaphylaxis   Shrimp Extract Itching   Penicillins Hives, Itching and Rash   Sulfa Antibiotics Hives, Itching and Rash    Physical Exam: Palpable pedal pulses noted.  There is moderate lacquer buildup on the second toenail bilateral.  There does appear to be some black sock size or fabric dye that is trapped in the lacquer solution on the nail.  This does not appear to be involved with intermittent nail subsidence.  Assessment/Plan of Care: 1. Dermatophytosis of nail     Discussed clinical findings with patient today.  The bilateral second toenails were debrided.  Once the lacquer was able to be removed from the nail, the right second toenail appears 90% resolved at this time.  The left second toenail is much better but has more involvement noted.  Patient was very happy with how the toes looked at the end of the appointment.  Follow-up in 3  to 4 months for recheck she will continue with the ciclopirox nail lacquer once daily to the bilateral second toenails   Langford Carias D. Axten Pascucci, DPM, FACFAS [facility]     [address] [place], [place] [address]                Office [phone]  Fax [phone]

## 2023-05-23 ENCOUNTER — Ambulatory Visit: Payer: Medicare HMO | Admitting: Podiatry

## 2023-05-23 DIAGNOSIS — B351 Tinea unguium: Secondary | ICD-10-CM | POA: Diagnosis not present

## 2023-05-23 NOTE — Progress Notes (Unsigned)
      Chief Complaint  Patient presents with   Nail Problem    Patient is here for recheck of bilateral 2nd toenail fungus.  She has been applying ciclopirox nail lacquer to area daily.  She's unsure if it's helping.    HPI: 73 y.o. female presents today for f/u of bilateral 2nd toenail fungus.  Using Loprox daily to the toenails.    Past Medical History:  Diagnosis Date   Arthritis    Diabetes mellitus without complication (HCC)    Elevated liver enzymes    GERD (gastroesophageal reflux disease)    History of kidney stones    Hypertension    Pre-diabetes     Past Surgical History:  Procedure Laterality Date   EYE SURGERY Left    PARATHYROIDECTOMY Left 08/08/2022   Procedure: LEFT INFERIOR PARATHYROIDECTOMY, NECK EXPLORATION; BIOPSY RIGHT SUPERIOR PARATHYROID;  Surgeon: Darnell Level, MD;  Location: [facility];  Service: General;  Laterality: Left;   TUBAL LIGATION      Allergies  Allergen Reactions   Shellfish Allergy Anaphylaxis   Shrimp Extract Itching   Penicillins Hives, Itching and Rash   Sulfa Antibiotics Hives, Itching and Rash    Physical Exam: Palpable pedal pulses bilateral.  Lateral angulation of 1st and 2nd toes at DIPJ's.  2nd toenails have Loprox lacquer build up noted.  Upon removal, the right 2nd toenail appears resolved, but the left was more difficult to determine due to thinness of nail and not being able to remove all the lacquer without causing pain.  Epicritic sensation intact.  Assessment/Plan of Care: 1. Dermatophytosis of nail    Discussed clinical findings with patient today.  Upon removal of the nail lacquer with sterile nail nippers and power debriding burr, the right second toenail appears healthy/ resolved of fungal involvement.  She does have lateral deviation of the first and second toes bilateral at the distal joint level.  This can make her prone to ingrown toenails along the medial borders due to biomechanics of toe position with walking.  The  left second toenail looks improved, but the nail was already so thin, I could not fget all of the additional lacquer off without causing pain.  Recommended patient stop treating the nail fungus for the next month and keep an eye on the nails.  Also discussed laser nail fungal treatment, even while using Loprox.   Gave her names of the tavaborole and Jublia Rx solutions to check on GoodRx as these topicals are typically better but not covered by her insurance.  She still wants to try to avoid oral medication.  She'll look into these other meds and call if she wants one called in if cost effective for her.  She'll consider the laser nail procedure and call to schedule if she'd like to proceed.  F/u prn    Clerance Lav, DPM, FACFAS [facility]     [address] [place], [place] [address]                Office [phone]  Fax [phone]

## 2023-05-29 ENCOUNTER — Ambulatory Visit: Payer: Medicare HMO | Admitting: Endocrinology

## 2023-05-29 ENCOUNTER — Encounter: Payer: Self-pay | Admitting: Endocrinology

## 2023-05-29 VITALS — BP 124/80 | HR 71 | Resp 20 | Ht 63.5 in | Wt 158.8 lb

## 2023-05-29 DIAGNOSIS — E1165 Type 2 diabetes mellitus with hyperglycemia: Secondary | ICD-10-CM

## 2023-05-29 DIAGNOSIS — E210 Primary hyperparathyroidism: Secondary | ICD-10-CM | POA: Diagnosis not present

## 2023-05-29 DIAGNOSIS — M810 Age-related osteoporosis without current pathological fracture: Secondary | ICD-10-CM

## 2023-05-29 DIAGNOSIS — Z9889 Other specified postprocedural states: Secondary | ICD-10-CM

## 2023-05-29 DIAGNOSIS — Z794 Long term (current) use of insulin: Secondary | ICD-10-CM | POA: Diagnosis not present

## 2023-05-29 LAB — POCT GLYCOSYLATED HEMOGLOBIN (HGB A1C): Hemoglobin A1C: 6.9 % — AB (ref 4.0–5.6)

## 2023-05-29 MED ORDER — LANTUS SOLOSTAR 100 UNIT/ML ~~LOC~~ SOPN: 10.0000 [IU] | PEN_INJECTOR | Freq: Every day | SUBCUTANEOUS | 4 refills | Status: DC

## 2023-05-29 MED ORDER — INSULIN PEN NEEDLE 32G X 4 MM MISC: 3 refills | Status: AC

## 2023-05-29 NOTE — Progress Notes (Addendum)
 Outpatient Endocrinology Note [place] Elen Acero, MD  05/29/23  Patient's Name: Jillian Powell    DOB: 07-Dec-1950    MRN: [id number]                                                    REASON OF VISIT: Follow up for type 2 diabetes mellitus  PCP: Theodis Shove, DO  HISTORY OF PRESENT ILLNESS:   Jillian Powell is a 73 y.o. old female with past medical history listed below, is here for follow up for type 2 diabetes mellitus , primary hyperparathyroidism status post parathyroidectomy, osteoporosis.  Patient was previously seen by Dr. Lucianne Muss in July 2024.  Pertinent Diabetes History: Patient was previously seen by Dr. Lucianne Muss and was last time seen in July 2024.  Patient was diagnosed with type 2 diabetes mellitus in March 2019 with hemoglobin A1c of 7%.  She had seen dietitian at time time and did not like to be on metformin.  Diabetes was previously managed by primary care provider however in July 2024, she was evaluated by Dr. Lucianne Muss in this endocrinology clinic and was started on Jardiance.  No personal history of pancreatitis and / or family history of medullary thyroid carcinoma or MEN 2B syndrome.   Chronic Diabetes Complications : Retinopathy: no. Last ophthalmology exam was done on annually., following with ophthalmology regularly.  Nephropathy: no Peripheral neuropathy: no Coronary artery disease: no Stroke: no  Relevant comorbidities and cardiovascular risk factors: Obesity: no Body mass index is 27.69 kg/m.  Hypertension: no  Hyperlipidemia : no, not on statin   Current / Home Diabetic regimen includes:  None  Prior diabetic medications: Patient stopped taking Jardiance in January 2025 due to nausea, stomach pain and constipation.  Glycemic data:   Accu-Chek guide glucometer data reviewed, download from March 10 to May 29, 2023 average blood sugar 121.  She has been checking mostly in the morning fasting occasionally, some of the fasting blood sugar 125,  118, 123, 124, 131, 106.  Hypoglycemia: Patient has no hypoglycemic episodes. Patient has hypoglycemia awareness.  Factors modifying glucose control: 1.  Diabetic diet assessment: 3 meals a day.  2.  Staying active or exercising:   3.  Medication compliance: compliant most of the time.  # Osteoporosis : -No history of fracture.  She has mild height loss. BONE density results: Consistent with osteoporosis.  Last DEXA scan in April 2022.     Lumbar spine L1-L4 Femoral neck (FN) 33% distal radius  T-scores 2019 -2.6 RFN: -2.3 LFN: -2.4 n/a  T-scores 06/2020 -2.9 -2.9 LFN Total femur = -2.8 n/a    Previous bone density in 2017 showed spine T score -2.5  Prior treatment has included boniva for 3 years, not clear of the starting and stopping dates, probably stopped in 2017. In 2019 she was given risedronate 150 mg monthly but she stopped this because of high cost. Subsequently was recommended alendronate but she did not take it. She did not want to take Reclast and was able to start Prolia after prior authorization.  - She had her first injection of Prolia 60 mg on 05/28/2021, continued every 6 months.  Last injection was in November 2024.   Also has been taking 2000 units of vitamin D3 since her baseline level was low, with normalization of serum vitamin D level.  #  Primary hyperparathyroidism/hypercalcemia -She had high calcium since November 2016, previous highest serum calcium was 11.4.  Highest PTH level was previously 46.  She had high calcium 526 in the urine in the past.  Because of persistent hypercalcemia, kidney stone and osteoporosis, she was referred for parathyroidectomy.  Parathyroids were not visualized on MRI and previous histories had not come from the parathyroid adenoma.  However next exploration showed left inferior parathyroid adenoma which was removed in June 2024 by Dr. Ricky Stabs.  Left inferior and right superior parathyroidectomy pathology was consistent with  hypercellular parathyroid tissue 286 mg ( left).  Patient has normalization of serum calcium after surgery.  Interval history  Hemoglobin A1c improved to 6.9%.  Patient reports she stopped taking Jardiance from January due to side effects as mentioned above.  She has been trying to be better on diet however she occasionally eats donuts and cookies.  She does not want to be on oral diabetic medications she rather wants to try insulin therapy.  No other complaints today.  Glucometer data as reviewed above.  REVIEW OF SYSTEMS As per history of present illness.   PAST MEDICAL HISTORY: Past Medical History:  Diagnosis Date   Arthritis    Diabetes mellitus without complication (HCC)    Elevated liver enzymes    GERD (gastroesophageal reflux disease)    History of kidney stones    Hypertension    Pre-diabetes     PAST SURGICAL HISTORY: Past Surgical History:  Procedure Laterality Date   EYE SURGERY Left    PARATHYROIDECTOMY Left 08/08/2022   Procedure: LEFT INFERIOR PARATHYROIDECTOMY, NECK EXPLORATION; BIOPSY RIGHT SUPERIOR PARATHYROID;  Surgeon: Darnell Level, MD;  Location: [facility];  Service: General;  Laterality: Left;   TUBAL LIGATION      ALLERGIES: Allergies  Allergen Reactions   Shellfish Allergy Anaphylaxis   Shrimp Extract Itching   Penicillins Hives, Itching and Rash   Sulfa Antibiotics Hives, Itching and Rash    FAMILY HISTORY:  Family History  Problem Relation Age of Onset   Diabetes Mother    CAD Mother    Other Father        old age died at 14   Thyroid disease Neg Hx    Breast cancer Neg Hx    Colon cancer Neg Hx    Liver cancer Neg Hx     SOCIAL HISTORY: Social History   Socioeconomic History   Marital status: Married    Spouse name: Not on file   Number of children: 4   Years of education: Not on file   Highest education level: Not on file  Occupational History   Occupation: retired  Tobacco Use   Smoking status: Never   Smokeless tobacco:  Never  Vaping Use   Vaping status: Never Used  Substance and Sexual Activity   Alcohol use: No   Drug use: No   Sexual activity: Not Currently  Other Topics Concern   Not on file  Social History Narrative   Not on file   Social Drivers of Health   Financial Resource Strain: Not on file  Food Insecurity: No Food Insecurity (08/08/2022)   Hunger Vital Sign    Worried About Running Out of Food in the Last Year: Never true    Ran Out of Food in the Last Year: Never true  Transportation Needs: No Transportation Needs (08/08/2022)   PRAPARE - Administrator, Civil Service (Medical): No    Lack of Transportation (Non-Medical): No  Physical  Activity: Not on file  Stress: Not on file  Social Connections: Unknown (07/18/2021)   Received from [facility]   Social Network    Social Network: Not on file    MEDICATIONS:  Current Outpatient Medications  Medication Sig Dispense Refill   Blood Glucose Monitoring Suppl (TGT BLOOD GLUCOSE MONITORING) w/Device KIT Use 1 unit as directed once a day  to check blood sugars.     Blood Glucose Monitoring Suppl DEVI 1 each by Does not apply route in the morning, at noon, and at bedtime. May substitute to any manufacturer covered by patient's insurance. 1 each 0   Cholecalciferol 25 MCG (1000 UT) tablet Take 1,000 Units by mouth daily.     ciclopirox (PENLAC) 8 % solution Apply topically at bedtime. Apply over nail and surrounding skin. Apply daily over previous coat. Remove weekly with polish remover. 6.6 mL 11   GINKGO BILOBA PO Take 1 tablet by mouth daily.     Glucose Blood (BLOOD GLUCOSE TEST STRIPS) STRP Check daily in the morning. May substitute to any manufacturer covered by patient's insurance. 100 each 3   ibuprofen (ADVIL) 200 MG tablet Take 400-600 mg by mouth 2 (two) times daily as needed for headache or moderate pain.     insulin glargine (LANTUS SOLOSTAR) 100 UNIT/ML Solostar Pen Inject 10 Units into the skin  daily. 15 mL 4   Insulin Pen Needle 32G X 4 MM MISC Use 1x a day 100 each 3   Lidocaine HCl (ASPERCREME LIDOCAINE) 4 % CREA Apply 1 Application topically daily as needed (pain).     LOTREL 5-10 MG capsule Take 1 capsule by mouth daily.   1   Multiple Vitamins-Minerals (ZINC PO) Take 1 capsule by mouth daily as needed (cold symptoms).     Polyethyl Glycol-Propyl Glycol (SYSTANE) 0.4-0.3 % SOLN Place 1 drop into both eyes at bedtime.     TRUE METRIX BLOOD GLUCOSE TEST test strip      TRUEplus Lancets 33G MISC      Current Facility-Administered Medications  Medication Dose Route Frequency Provider Last Rate Last Admin   [START ON 07/26/2023] denosumab (PROLIA) injection 60 mg  60 mg Subcutaneous Once Roselle Norton, [place], MD        PHYSICAL EXAM: Vitals:   05/29/23 1013  BP: 124/80  Pulse: 71  Resp: 20  SpO2: 97%  Weight: 158 lb 12.8 oz (72 kg)  Height: 5' 3.5" (1.613 m)   Body mass index is 27.69 kg/m.  Wt Readings from Last 3 Encounters:  05/29/23 158 lb 12.8 oz (72 kg)  01/26/23 155 lb 9.6 oz (70.6 kg)  10/04/22 157 lb 9.6 oz (71.5 kg)    General: Well developed, well nourished female in no apparent distress.  HEENT: AT/[place], no external lesions.  Eyes: Conjunctiva clear and no icterus. Neck: Neck supple  Lungs: Respirations not labored Neurologic: Alert, oriented, normal speech Extremities / Skin: Dry. No sores or rashes noted.  No spine tenderness. Psychiatric: Does not appear depressed or anxious  Diabetic Foot Exam - Simple   Simple Foot Form Diabetic Foot exam was performed with the following findings: Yes 05/29/2023 11:36 AM  Visual Inspection Sensation Testing Pulse Check Comments Patient reports diabetic foot exam was done by primary care provider 1 week ago, was normal, declines to do today.     LABS Reviewed Lab Results  Component Value Date   HGBA1C 6.9 (A) 05/29/2023   HGBA1C 7.6 (H) 12/01/2022   HGBA1C 6.7 (H)  08/05/2022   No results found for:  "FRUCTOSAMINE" No results found for: "CHOL", "HDL", "LDLCALC", "LDLDIRECT", "TRIG", "CHOLHDL" Lab Results  Component Value Date   MICRALBCREAT 1.0 01/26/2023   Lab Results  Component Value Date   CREATININE 0.83 12/01/2022   Lab Results  Component Value Date   GFR 70.40 12/01/2022   Parathyroid scan done in 03/2016: Mild activity corresponding to 5 x 5 x 3 mm nodular density posterior to the upper pole of the right thyroid lobe. These findings suggest the possibility of small parathyroid adenoma at this site.    THYROID ultrasound from 12/22 shows a 6 mm nodule adjacent to the left thyroid lobe suspicious for parathyroid adenoma but no nodule on the right side  ASSESSMENT / PLAN  1. Uncontrolled type 2 diabetes mellitus with hyperglycemia, without long-term current use of insulin (HCC)   2. Age-related osteoporosis without current pathological fracture   3. History of parathyroid surgery   4. Primary hyperparathyroidism (HCC)     Diabetes Mellitus type 2, complicated by no known complications. - Diabetic status / severity: Uncontrolled.  Lab Results  Component Value Date   HGBA1C 6.9 (A) 05/29/2023    - Hemoglobin A1c goal : <6.5%  Not on diabetic medication at this time.  She would rather want to try insulin therapy.  - Medications: See below.  I) start Lantus 10 units daily.  - Home glucose testing: In the morning fasting daily and occasionally at bedtime. - Discussed/ Gave Hypoglycemia treatment plan.  # Consult : not required at this time.   # Annual urine for microalbuminuria/ creatinine ratio, no microalbuminuria currently. Last  Lab Results  Component Value Date   MICRALBCREAT 1.0 01/26/2023    # Foot check nightly.  # Annual dilated diabetic eye exams.   - Diet: Make healthy diabetic food choices - Life style / activity / exercise: Discussed.  2. Blood pressure  -  BP Readings from Last 1 Encounters:  05/29/23 124/80    - Control is in  target.  - No change in current plans.  3. Lipid status / Hyperlipidemia - Last No results found for: "LDLCALC" -Currently not on statin.  Patient reports she had lab work with primary care provider recently.  Managed by primary care provider.  # Osteoporosis -Continue Prolia 60 mg subcutaneous every 6 months.  She received injection in the clinic November 2024, next due in May. -Continue current dose of vitamin D3 2000 international unit daily. -Advised to take additional calcium 500 mg over-the-counter 1 tablet daily.  In addition to her dietary calcium intake in the form of cheese and milk. -Discussed about fall precautions. -Will check DEXA scan, scheduled for July 2025.  # Primary hyperparathyroidism status post parathyroidectomy in June 2024. -Patient has history of kidney stone.  She has normalization of serum calcium after parathyroid surgery.  Will monitor serum calcium annually.  Diagnoses and all orders for this visit:  Uncontrolled type 2 diabetes mellitus with hyperglycemia, without long-term current use of insulin (HCC) -     POCT glycosylated hemoglobin (Hb A1C) -     insulin glargine (LANTUS SOLOSTAR) 100 UNIT/ML Solostar Pen; Inject 10 Units into the skin daily. -     Insulin Pen Needle 32G X 4 MM MISC; Use 1x a day  Age-related osteoporosis without current pathological fracture  History of parathyroid surgery  Primary hyperparathyroidism (HCC)   DISPOSITION Follow up in clinic in 4  months suggested.   All questions answered and patient verbalized  understanding of the plan.  [place] Maansi Wike, MD [facility] Endocrinology [facility] [address] [place], [address] [place], [place] [address] Phone # [phone]  At least part of this note was generated using voice recognition software. Inadvertent word errors may have occurred, which were not recognized during the proofreading process.

## 2023-05-29 NOTE — Patient Instructions (Signed)
 Start lantus 10 units daily.

## 2023-06-06 ENCOUNTER — Other Ambulatory Visit: Payer: Self-pay | Admitting: Endocrinology

## 2023-06-21 NOTE — Telephone Encounter (Signed)
 Prolia VOB initiated via [email or web address]  Next Prolia inj DUE: 07/27/23

## 2023-07-09 NOTE — Telephone Encounter (Signed)
 Prior Authorization REQUIRED for PROLIA   PA PROCESS DETAILS: PA is required. PA can be initiated by calling [phone] or online at [email or web address].

## 2023-07-14 NOTE — Telephone Encounter (Signed)
 Prior Authorization initiated for PROLIA  via [email or web address] KEY: [id number]    [employer]

## 2023-07-17 NOTE — Telephone Encounter (Signed)
 [employer]  Prior Authorization for PROLIA  APPROVED PA# [id number] Valid: 03/31/21-03/06/24

## 2023-07-17 NOTE — Telephone Encounter (Signed)
 Patient is ready for scheduling on or after 07/27/23 BUY AND BILL  Out-of-pocket cost due at time of visit: $356.87  Primary: [employer] Medicare Adv HMO Prolia  co-insurance: 20% (approximately $331.87) Admin fee co-insurance: 20% (approximately $25)  Deductible: does not apply  Prior Auth: APPROVED PA# [id number] Valid: 03/31/21-03/06/24  Secondary: N/A Prolia  co-insurance:  Admin fee co-insurance:  Deductible:  Prior Auth:  PA# Valid:   ** This summary of benefits is an estimation of the patient's out-of-pocket cost. Exact cost may vary based on individual plan coverage.

## 2023-07-28 ENCOUNTER — Ambulatory Visit

## 2023-07-28 DIAGNOSIS — M810 Age-related osteoporosis without current pathological fracture: Secondary | ICD-10-CM

## 2023-07-28 MED ORDER — DENOSUMAB 60 MG/ML ~~LOC~~ SOSY
60.0000 mg | PREFILLED_SYRINGE | Freq: Once | SUBCUTANEOUS | Status: AC
  Administered 2024-01-30: 60 mg via SUBCUTANEOUS

## 2023-07-28 NOTE — Progress Notes (Addendum)
 Pt was in office today for Prolia  injection given in the Right arm with no problems. I advise pt to wait in  the lobby 30 mins.

## 2023-07-29 NOTE — Telephone Encounter (Signed)
 Last Prolia  inj 07/28/23 Next Prolia  inj due 01/29/24

## 2023-09-14 ENCOUNTER — Other Ambulatory Visit: Payer: Self-pay | Admitting: Endocrinology

## 2023-09-15 NOTE — Telephone Encounter (Signed)
 Refill request complete

## 2023-09-18 ENCOUNTER — Ambulatory Visit (HOSPITAL_BASED_OUTPATIENT_CLINIC_OR_DEPARTMENT_OTHER)
Admission: RE | Admit: 2023-09-18 | Discharge: 2023-09-18 | Disposition: A | Discharge status: Home / Self Care | Source: Ambulatory Visit | Attending: Endocrinology | Admitting: Endocrinology

## 2023-09-18 DIAGNOSIS — M810 Age-related osteoporosis without current pathological fracture: Secondary | ICD-10-CM | POA: Insufficient documentation

## 2023-09-19 ENCOUNTER — Other Ambulatory Visit: Payer: Medicare HMO

## 2023-09-22 ENCOUNTER — Other Ambulatory Visit: Payer: Self-pay | Admitting: Family Medicine

## 2023-09-22 DIAGNOSIS — Z1231 Encounter for screening mammogram for malignant neoplasm of breast: Secondary | ICD-10-CM

## 2023-10-04 ENCOUNTER — Encounter: Payer: Self-pay | Admitting: Endocrinology

## 2023-10-04 ENCOUNTER — Ambulatory Visit: Admitting: Endocrinology

## 2023-10-04 ENCOUNTER — Ambulatory Visit: Payer: Self-pay | Admitting: Endocrinology

## 2023-10-04 VITALS — BP 122/80 | HR 73 | Resp 20 | Ht 63.5 in | Wt 158.4 lb

## 2023-10-04 DIAGNOSIS — Z9889 Other specified postprocedural states: Secondary | ICD-10-CM | POA: Diagnosis not present

## 2023-10-04 DIAGNOSIS — Z794 Long term (current) use of insulin: Secondary | ICD-10-CM | POA: Diagnosis not present

## 2023-10-04 DIAGNOSIS — E1165 Type 2 diabetes mellitus with hyperglycemia: Secondary | ICD-10-CM | POA: Diagnosis not present

## 2023-10-04 DIAGNOSIS — E210 Primary hyperparathyroidism: Secondary | ICD-10-CM

## 2023-10-04 DIAGNOSIS — M810 Age-related osteoporosis without current pathological fracture: Secondary | ICD-10-CM

## 2023-10-04 MED ORDER — METFORMIN HCL ER 500 MG PO TB24: 500.0000 mg | ORAL_TABLET | Freq: Every day | ORAL | 3 refills | Status: DC

## 2023-10-04 MED ORDER — LANTUS SOLOSTAR 100 UNIT/ML ~~LOC~~ SOPN: 5.0000 [IU] | PEN_INJECTOR | Freq: Every day | SUBCUTANEOUS | 4 refills | Status: AC

## 2023-10-04 NOTE — Patient Instructions (Signed)
 Start metformin  XR 500 mg DAILY. Decrease lantus  5 units daily.

## 2023-10-04 NOTE — Progress Notes (Signed)
 Outpatient Endocrinology Note Jillian Anaira Seay, MD  10/04/23  Patient's Name: Jillian Powell    DOB: 03-Sep-1950    MRN: [id number]                                                    REASON OF VISIT: Follow up for type 2 diabetes mellitus  PCP: Judyth Isaiah Bottcher, DO  HISTORY OF PRESENT ILLNESS:   Loriana Samad is a 73 y.o. old female with past medical history listed below, is here for follow up for type 2 diabetes mellitus , primary hyperparathyroidism status post parathyroidectomy, osteoporosis.   Pertinent Diabetes History: Patient was previously seen by Dr. Von and was last time seen in July 2024.  Patient was diagnosed with type 2 diabetes mellitus in March 2019 with hemoglobin A1c of 7%.  She had seen dietitian at time time and did not like to be on metformin .  Diabetes was previously managed by primary care provider however in July 2024, she was evaluated by Dr. Von in this endocrinology clinic and was started on Jardiance .  No personal history of pancreatitis and / or family history of medullary thyroid carcinoma or MEN 2B syndrome.   Chronic Diabetes Complications : Retinopathy: no. Last ophthalmology exam was done on annually, following with ophthalmology regularly.  Nephropathy: no Peripheral neuropathy: no Coronary artery disease: no Stroke: no  Relevant comorbidities and cardiovascular risk factors: Obesity: no Body mass index is 27.62 kg/m.  Hypertension: no  Hyperlipidemia : no, not on statin   Current / Home Diabetic regimen includes:  Lantus  10 units daily.  Patient preferred to be only on insulin  therapy and was started in March 2025.  Prior diabetic medications: Patient stopped taking Jardiance  in January 2025 due to nausea, stomach pain and constipation.  Glycemic data:   Patient has Accu-Chek guide glucometer.  Forgot to bring glucometer in the clinic today.  By recall of fasting blood sugar are 116, 110, 115,  130.  Hypoglycemia: Patient has no hypoglycemic episodes. Patient has hypoglycemia awareness.  Factors modifying glucose control: 1.  Diabetic diet assessment: 3 meals a day.  2.  Staying active or exercising:   3.  Medication compliance: compliant most of the time.  # Osteoporosis : -No history of fracture.  She has mild height loss.  -DEXA scan on September 18, 2023 consistent with osteoporosis, T-score at L1, L2 -3.0, left femoral neck T-score -2.6, left total hip T-score -2.4, right femoral neck T-score -2.3, right total hip T-score -2.3.  DEXA scan not compared with DEXA scan in 2022.  BONE density results: Consistent with osteoporosis.   DEXA scan in April 2022.     Lumbar spine L1-L4 Femoral neck (FN) 33% distal radius  T-scores 2019 -2.6 RFN: -2.3 LFN: -2.4 n/a  T-scores 06/2020 -2.9 -2.9 LFN Total femur = -2.8 n/a    Previous bone density in 2017 showed spine T score -2.5  Prior treatment has included boniva for 3 years, not clear of the starting and stopping dates, probably stopped in 2017. In 2019 she was given risedronate  150 mg monthly but she stopped this because of high cost. Subsequently was recommended alendronate but she did not take it. She did not want to take Reclast and was able to start Prolia  after prior authorization.  - She had her first injection of Prolia   60 mg on 05/28/2021, continued every 6 months.  Last injection was in May 2025.   Also has been taking 2000 units of vitamin D3 since her baseline level was low, with normalization of serum vitamin D  level.  # Primary hyperparathyroidism/hypercalcemia -She had high calcium since November 2016, previous highest serum calcium was 11.4.  Highest PTH level was previously 46.  She had high calcium 526 in the urine in the past.  Because of persistent hypercalcemia, kidney stone and osteoporosis, she was referred for parathyroidectomy.  Parathyroids were not visualized on MRI and previous histories had not come from  the parathyroid  adenoma.  However next exploration showed left inferior parathyroid  adenoma which was removed in June 2024 by Dr. Cyrilla.  Left inferior and right superior parathyroidectomy pathology was consistent with hypercellular parathyroid  tissue 286 mg ( left).  Patient has normalization of serum calcium after surgery.  Interval history  Hemoglobin A1c recently with PCP office reported by patient 6.8%.  Patient has been taking Lantus  10 units daily.  She may miss occasionally.  Glucose data as reviewed above.  She is trying to eat better.  She wants to consider taking metformin  and ultimately get rid of insulin  therapy.  Recent DEXA scan was reviewed and as noted above.  Overall possibly mild improvement on bone mineral density.  No fall and fracture.  She has been taking vitamin D  supplement.  She used dietary calcium, does not take calcium supplement.  She had Prolia  injection in May 2025.  No other complaints today.  REVIEW OF SYSTEMS As per history of present illness.   PAST MEDICAL HISTORY: Past Medical History:  Diagnosis Date   Arthritis    Diabetes mellitus without complication (HCC)    Elevated liver enzymes    GERD (gastroesophageal reflux disease)    History of kidney stones    Hypertension    Pre-diabetes     PAST SURGICAL HISTORY: Past Surgical History:  Procedure Laterality Date   EYE SURGERY Left    PARATHYROIDECTOMY Left 08/08/2022   Procedure: LEFT INFERIOR PARATHYROIDECTOMY, NECK EXPLORATION; BIOPSY RIGHT SUPERIOR PARATHYROID ;  Surgeon: Eletha Boas, MD;  Location: [facility];  Service: General;  Laterality: Left;   TUBAL LIGATION      ALLERGIES: Allergies  Allergen Reactions   Shellfish Allergy Anaphylaxis   Shrimp Extract Itching   Penicillins Hives, Itching and Rash   Sulfa Antibiotics Hives, Itching and Rash    FAMILY HISTORY:  Family History  Problem Relation Age of Onset   Diabetes Mother    CAD Mother    Other Father        old age died at  3   Thyroid disease Neg Hx    Breast cancer Neg Hx    Colon cancer Neg Hx    Liver cancer Neg Hx     SOCIAL HISTORY: Social History   Socioeconomic History   Marital status: Married    Spouse name: Not on file   Number of children: 4   Years of education: Not on file   Highest education level: Not on file  Occupational History   Occupation: retired  Tobacco Use   Smoking status: Never   Smokeless tobacco: Never  Vaping Use   Vaping status: Never Used  Substance and Sexual Activity   Alcohol  use: No   Drug use: No   Sexual activity: Not Currently  Other Topics Concern   Not on file  Social History Narrative   Not on file   Social Drivers  of Health   Financial Resource Strain: Not on file  Food Insecurity: No Food Insecurity (08/08/2022)   Hunger Vital Sign    Worried About Running Out of Food in the Last Year: Never true    Ran Out of Food in the Last Year: Never true  Transportation Needs: No Transportation Needs (08/08/2022)   PRAPARE - Administrator, Civil Service (Medical): No    Lack of Transportation (Non-Medical): No  Physical Activity: Not on file  Stress: Not on file  Social Connections: Unknown (07/18/2021)   Received from [facility]   Social Network    Social Network: Not on file    MEDICATIONS:  Current Outpatient Medications  Medication Sig Dispense Refill   Accu-Chek Softclix Lancets lancets USE AS DIRECTED TO TEST BLOOD GLUCOSE IN THE MORNING, AT NOON, AND AT BEDTIME 100 each 0   Blood Glucose Monitoring Suppl (TGT BLOOD GLUCOSE MONITORING) w/Device KIT Use 1 unit as directed once a day  to check blood sugars.     Blood Glucose Monitoring Suppl DEVI 1 each by Does not apply route in the morning, at noon, and at bedtime. May substitute to any manufacturer covered by patient's insurance. 1 each 0   Cholecalciferol 25 MCG (1000 UT) tablet Take 1,000 Units by mouth daily.     ciclopirox  (PENLAC ) 8 % solution Apply topically at bedtime.  Apply over nail and surrounding skin. Apply daily over previous coat. Remove weekly with polish remover. 6.6 mL 11   GINKGO BILOBA PO Take 1 tablet by mouth daily.     Glucose Blood (BLOOD GLUCOSE TEST STRIPS) STRP Check daily in the morning. May substitute to any manufacturer covered by patient's insurance. 100 each 3   ibuprofen (ADVIL) 200 MG tablet Take 400-600 mg by mouth 2 (two) times daily as needed for headache or moderate pain.     Insulin  Pen Needle 32G X 4 MM MISC Use 1x a day 100 each 3   Lidocaine  HCl (ASPERCREME LIDOCAINE ) 4 % CREA Apply 1 Application topically daily as needed (pain).     LOTREL 5-10 MG capsule Take 1 capsule by mouth daily.   1   metFORMIN  (GLUCOPHAGE -XR) 500 MG 24 hr tablet Take 1 tablet (500 mg total) by mouth daily with breakfast. Start metformin  XR 500 mg 1 tab daily for 1 week, then increase to 2 tab daily for 1 week, then increase to 3 tabs daily for 1 week and then increase to 4 tabs daily. 90 tablet 3   Multiple Vitamins-Minerals (ZINC PO) Take 1 capsule by mouth daily as needed (cold symptoms).     Polyethyl Glycol-Propyl Glycol (SYSTANE) 0.4-0.3 % SOLN Place 1 drop into both eyes at bedtime.     TRUE METRIX BLOOD GLUCOSE TEST test strip      insulin  glargine (LANTUS  SOLOSTAR) 100 UNIT/ML Solostar Pen Inject 5 Units into the skin daily. 15 mL 4   Current Facility-Administered Medications  Medication Dose Route Frequency Provider Last Rate Last Admin   [START ON 01/28/2024] denosumab  (PROLIA ) injection 60 mg  60 mg Subcutaneous Once Edmond Ginsberg, [place], MD        PHYSICAL EXAM: Vitals:   10/04/23 0944  BP: 122/80  Pulse: 73  Resp: 20  SpO2: 97%  Weight: 158 lb 6.4 oz (71.8 kg)  Height: 5' 3.5 (1.613 m)   Body mass index is 27.62 kg/m.  Wt Readings from Last 3 Encounters:  10/04/23 158 lb 6.4 oz (71.8 kg)  05/29/23 158 lb  12.8 oz (72 kg)  01/26/23 155 lb 9.6 oz (70.6 kg)    General: Well developed, well nourished female in no apparent distress.   HEENT: AT/[place], no external lesions.  Eyes: Conjunctiva clear and no icterus. Neck: Neck supple  Lungs: Respirations not labored Neurologic: Alert, oriented, normal speech Extremities / Skin: Dry. No sores or rashes noted.  No spine tenderness. Psychiatric: Does not appear depressed or anxious  Diabetic Foot Exam - Simple   No data filed     LABS Reviewed Lab Results  Component Value Date   HGBA1C 6.9 (A) 05/29/2023   HGBA1C 7.6 (H) 12/01/2022   HGBA1C 6.7 (H) 08/05/2022   No results found for: FRUCTOSAMINE No results found for: CHOL, HDL, LDLCALC, LDLDIRECT, TRIG, CHOLHDL No results found for: MICRALBCREAT  Lab Results  Component Value Date   CREATININE 0.83 12/01/2022   Lab Results  Component Value Date   GFR 70.40 12/01/2022   Parathyroid  scan done in 03/2016: Mild activity corresponding to 5 x 5 x 3 mm nodular density posterior to the upper pole of the right thyroid lobe. These findings suggest the possibility of small parathyroid  adenoma at this site.    THYROID ultrasound from 12/22 shows a 6 mm nodule adjacent to the left thyroid lobe suspicious for parathyroid  adenoma but no nodule on the right side  ASSESSMENT / PLAN  1. Type 2 diabetes mellitus with hyperglycemia, with long-term current use of insulin  (HCC)   2. Age-related osteoporosis without current pathological fracture   3. History of parathyroid  surgery   4. Primary hyperparathyroidism (HCC)      Diabetes Mellitus type 2, complicated by no other known complications. - Diabetic status / severity: Uncontrolled.  Lab Results  Component Value Date   HGBA1C 6.9 (A) 05/29/2023    - Hemoglobin A1c goal : <6.5%  She wants to try metformin .  Lantus  was started in March when she did not want any oral antidiabetic medication and preferred for insulin  therapy.  - Medications: See below.  I) start metformin  extended release 500 mg daily. 2) decrease Lantus  from 10 to 5 units daily.  -  Home glucose testing: In the morning fasting daily and occasionally at bedtime. - Discussed/ Gave Hypoglycemia treatment plan.  # Consult : not required at this time.   # Annual urine for microalbuminuria/ creatinine ratio, no microalbuminuria currently.  Urine microalbumin creatinine ratio was normal in November 2024. Last  No results found for: MICRALBCREAT   # Foot check nightly.  # Annual dilated diabetic eye exams.   - Diet: Make healthy diabetic food choices - Life style / activity / exercise: Discussed.  2. Blood pressure  -  BP Readings from Last 1 Encounters:  10/04/23 122/80    - Control is in target.  - No change in current plans.  3. Lipid status / Hyperlipidemia - Last No results found for: [facility] -Currently not on statin.  Patient reports she had lab work with primary care provider recently.  Managed by primary care provider.  # Osteoporosis -Continue Prolia  60 mg subcutaneous every 6 months.  She received injection in the clinic was in May 2025.  Next dose in October 2025. -Continue current dose of vitamin D3 2000 international unit daily. -Advised to take additional calcium 500 mg over-the-counter 1 tablet daily.  In addition to her dietary calcium intake in the form of cheese and milk. -Discussed about fall precautions. - Last DEXA scan in July 2025, reviewed possibly mild improvement in bone mineral  density.  # Primary hyperparathyroidism status post parathyroidectomy in June 2024. -Patient has history of kidney stone.  She has normalization of serum calcium after parathyroid  surgery.  Will monitor serum calcium annually.  Diagnoses and all orders for this visit:  Type 2 diabetes mellitus with hyperglycemia, with long-term current use of insulin  (HCC) -     Amb Referral to Nutrition and Diabetic Education -     metFORMIN  (GLUCOPHAGE -XR) 500 MG 24 hr tablet; Take 1 tablet (500 mg total) by mouth daily with breakfast. Start metformin  XR 500 mg 1 tab  daily for 1 week, then increase to 2 tab daily for 1 week, then increase to 3 tabs daily for 1 week and then increase to 4 tabs daily. -     insulin  glargine (LANTUS  SOLOSTAR) 100 UNIT/ML Solostar Pen; Inject 5 Units into the skin daily.  Age-related osteoporosis without current pathological fracture  History of parathyroid  surgery  Primary hyperparathyroidism (HCC)    DISPOSITION Follow up in clinic in 4  months suggested.   All questions answered and patient verbalized understanding of the plan.  Jillian Anju Sereno, MD [facility] Endocrinology [facility] [address] [place], [address] [place], [place] [address] Phone # [phone]  At least part of this note was generated using voice recognition software. Inadvertent word errors may have occurred, which were not recognized during the proofreading process.

## 2023-10-04 NOTE — Progress Notes (Signed)
 Patient stated she recently had her A1C completed at her PCP with reading of 6.8. Patient given fax number to have them fax it over to add as a part of her chart.

## 2023-10-18 ENCOUNTER — Telehealth: Payer: Self-pay

## 2023-10-18 DIAGNOSIS — E1165 Type 2 diabetes mellitus with hyperglycemia: Secondary | ICD-10-CM

## 2023-10-18 DIAGNOSIS — Z794 Long term (current) use of insulin: Secondary | ICD-10-CM

## 2023-10-18 MED ORDER — METFORMIN HCL ER 500 MG PO TB24: 500.0000 mg | ORAL_TABLET | Freq: Every day | ORAL | 3 refills | Status: AC

## 2023-10-18 NOTE — Addendum Note (Signed)
 Addended by: Thorsten Climer, [place] on: 10/18/2023 02:22 PM   Modules accepted: Orders

## 2023-10-18 NOTE — Telephone Encounter (Signed)
 Patient called requesting medications be adjusted. Per patient she feels her dosages of her metformin  is too high based on her readings and A1Cs.

## 2023-10-18 NOTE — Telephone Encounter (Signed)
 She can stay on metformin  extended release 500 mg 1 tablet daily.  This is her lowest dose.

## 2023-11-01 ENCOUNTER — Ambulatory Visit
Admission: RE | Admit: 2023-11-01 | Discharge: 2023-11-01 | Disposition: A | Discharge status: Home / Self Care | Source: Ambulatory Visit | Attending: Family Medicine

## 2023-11-01 DIAGNOSIS — Z1231 Encounter for screening mammogram for malignant neoplasm of breast: Secondary | ICD-10-CM

## 2023-12-01 ENCOUNTER — Ambulatory Visit: Admitting: Dietician

## 2023-12-25 ENCOUNTER — Encounter: Payer: Self-pay | Admitting: Dietician

## 2023-12-25 ENCOUNTER — Encounter: Attending: Endocrinology | Admitting: Dietician

## 2023-12-25 VITALS — Ht 64.25 in | Wt 158.0 lb

## 2023-12-25 DIAGNOSIS — E119 Type 2 diabetes mellitus without complications: Secondary | ICD-10-CM | POA: Insufficient documentation

## 2023-12-25 NOTE — Patient Instructions (Addendum)
 I6X7 - consider this supplement option  Avoid high fructose corn syrup Rethink your drinks - mostly water Low saturated fat diet    Walk most days of the week for 30 minutes.

## 2023-12-25 NOTE — Progress Notes (Signed)
 Diabetes Self-Management Education  Visit Type: First/Initial  Appt. Start Time: 0905 Appt. End Time: 1010  12/25/2023  Jillian Powell, identified by name and date of birth, is a 73 y.o. female with a diagnosis of Diabetes: Type 2.   ASSESSMENT Patient is here today alone.  She was last seen by myself in 2019. She states that she has been battling prediabetes for years and has gotten into the diabetes range in 2024.  She states that she was tired of hearing about it.  She was put on insulin  but didn't want this and was placed on metformin .  She would like to come off of all diabetes medications if possible. She enjoys Hispanic foods.  History includes:  Type 2 Diabetes (2019), fatty liver, GERD, HTN Medications include: Metformin , Vitamin D  Labs noted to include:  A1C 6.9% 05/29/2023 decreased from 7.6% 12/01/2022 AST 55, ALT 58 on 07/10/2022, Vitamin D  44 on 09/2022, GFR 70 on 12/01/2022  64.25 158 lbs 12/25/2023 157 lbs in 2019  Patient lives with her husband.  She does the shopping and cooking. Allergy to Shellfish and Shrimp. 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 Walks beagle at times but lost habit.  Height 5' 4.25 (1.632 m), weight 158 lb (71.7 kg). Body mass index is 26.91 kg/m.   Diabetes Self-Management Education - 12/25/23 0936       Visit Information   Visit Type First/Initial      Initial Visit   Diabetes Type Type 2    Date Diagnosed 2019    Are you currently following a meal plan? Yes      Health Coping   How would you rate your overall health? Good      Psychosocial Assessment   Patient Belief/Attitude about Diabetes Motivated to manage diabetes    What is the hardest part about your diabetes right now, causing you the most concern, or is the most worrisome to you about your diabetes?   Checking blood sugar    Self-care barriers None    Self-management  support Doctor's office    Other persons present Patient    Patient Concerns Nutrition/Meal planning;Glycemic Control;Healthy Lifestyle    Special Needs None    Preferred Learning Style No preference indicated    Learning Readiness Ready    How often do you need to have someone help you when you read instructions, pamphlets, or other written materials from your doctor or pharmacy? 1 - Never    What is the last grade level you completed in school? Master's      Pre-Education Assessment   Patient understands the diabetes disease and treatment process. Needs Review    Patient understands incorporating nutritional management into lifestyle. Needs Review    Patient undertands incorporating physical activity into lifestyle. Needs Review    Patient understands using medications safely. Needs Review    Patient understands monitoring blood glucose, interpreting and using results Needs Review    Patient understands prevention, detection, and treatment of acute complications. Needs Review    Patient understands prevention, detection, and treatment of chronic complications. Needs Review    Patient understands how to develop strategies to address psychosocial issues. Needs Review    Patient understands how to develop strategies to promote health/change behavior. Needs Review      Complications   Last HgB A1C per patient/outside source 6.9 %   05/29/2023   How often do you  check your blood sugar? 1-2 times/day    Fasting Blood glucose range (mg/dL) 29-870    Postprandial Blood glucose range (mg/dL) 869-820    Number of hypoglycemic episodes per month 0    Number of hyperglycemic episodes ( >200mg /dL): Never    Have you had a dilated eye exam in the past 12 months? Yes    Have you had a dental exam in the past 12 months? Yes    Are you checking your feet? Yes    How many days per week are you checking your feet? 4      Dietary Intake   Breakfast coffee with half and half, 1 slice [employer] raisin toast  with butter    Snack (morning) cheese    Lunch salad OR half sandwich OR crackers if big lunch AND fruit    Snack (afternoon) fresh fruit    Dinner salmon burger, rice, salad, olives and cheese    Snack (evening) chocolate turtle    Beverage(s) coffee, half and half, water, occasional regular coke, lite lemonade      Activity / Exercise   Activity / Exercise Type Light (walking / raking leaves)    How many days per week do you exercise? 1    How many minutes per day do you exercise? 30    Total minutes per week of exercise 30      Patient Education   Previous Diabetes Education Yes   2019   Disease Pathophysiology Other (comment);Explored patient's options for treatment of their diabetes   reviewed how to improve insulin  resistence   Healthy Eating Role of diet in the treatment of diabetes and the relationship between the three main macronutrients and blood glucose level;Food label reading, portion sizes and measuring food.;Plate Method;Meal options for control of blood glucose level and chronic complications.    Being Active Role of exercise on diabetes management, blood pressure control and cardiac health.    Medications Reviewed patients medication for diabetes, action, purpose, timing of dose and side effects.    Monitoring Identified appropriate SMBG and/or A1C goals.;Daily foot exams;Yearly dilated eye exam    Acute complications Taught prevention, symptoms, and  treatment of hypoglycemia - the 15 rule.;Discussed and identified patients' prevention, symptoms, and treatment of hyperglycemia.    Chronic complications Relationship between chronic complications and blood glucose control    Diabetes Stress and Support Identified and addressed patients feelings and concerns about diabetes;Worked with patient to identify barriers to care and solutions;Role of stress on diabetes      Individualized Goals (developed by patient)   Nutrition General guidelines for healthy choices and portions  discussed    Physical Activity Exercise 5-7 days per week;30 minutes per day    Medications take my medication as prescribed    Monitoring  Test my blood glucose as discussed    Problem Solving Addressing barriers to behavior change    Reducing Risk do foot checks daily      Post-Education Assessment   Patient understands the diabetes disease and treatment process. Comprehends key points    Patient understands incorporating nutritional management into lifestyle. Comprehends key points    Patient undertands incorporating physical activity into lifestyle. Comprehends key points    Patient understands using medications safely. Comphrehends key points    Patient understands monitoring blood glucose, interpreting and using results Comprehends key points    Patient understands prevention, detection, and treatment of acute complications. Comprehends key points    Patient understands prevention, detection, and treatment  of chronic complications. Comprehends key points    Patient understands how to develop strategies to address psychosocial issues. Comprehends key points    Patient understands how to develop strategies to promote health/change behavior. Needs Review      Outcomes   Expected Outcomes Demonstrated interest in learning. Expect positive outcomes    Future DMSE PRN    Program Status Completed          Individualized Plan for Diabetes Self-Management Training:   Learning Objective:  Patient will have a greater understanding of diabetes self-management. Patient education plan is to attend individual and/or group sessions per assessed needs and concerns.   Plan:   Patient Instructions  [phone] - consider this supplement option  Avoid high fructose corn syrup Rethink your drinks - mostly water Low saturated fat diet    Walk most days of the week for 30 minutes.  Expected Outcomes:  Demonstrated interest in learning. Expect positive outcomes  Education material provided: ADA  - How to Thrive: A Guide for Your Journey with Diabetes, Food label handouts, Meal plan card, Snack sheet, and Diabetes Resources, [employer] of Lifestyle Medicine) packet  If problems or questions, patient to contact team via:  Phone  Future DSME appointment: PRN

## 2024-01-08 NOTE — Telephone Encounter (Signed)
 Prolia  VOB initiated via [email or web address]  Next Prolia  inj DUE: 01/29/24

## 2024-01-22 NOTE — Telephone Encounter (Signed)
 [employer] - Prior Authorization REQUIRED for [facility]

## 2024-01-24 NOTE — Telephone Encounter (Signed)
 [employer]  Prior Authorization on file and valid PA# [id number] Valid: 03/31/21-03/06/24       Prior auth renewal initiated via [email or web address] KEY: [id number]

## 2024-01-30 ENCOUNTER — Ambulatory Visit

## 2024-01-30 VITALS — BP 138/82 | HR 63 | Resp 16

## 2024-01-30 DIAGNOSIS — M810 Age-related osteoporosis without current pathological fracture: Secondary | ICD-10-CM | POA: Diagnosis not present

## 2024-01-30 MED ORDER — DENOSUMAB 60 MG/ML ~~LOC~~ SOSY: 60.0000 mg | PREFILLED_SYRINGE | Freq: Once | SUBCUTANEOUS | Status: AC

## 2024-01-30 NOTE — Progress Notes (Signed)
 After obtaining consent, and per orders of Dr. Erroll Luna, injection of Prolia given by Tera Partridge. Patient instructed to remain in clinic for 20 minutes afterwards, and to report any adverse reaction to me immediately.

## 2024-02-04 NOTE — Telephone Encounter (Signed)
 Last Prolia  inj 01/30/24 Next Prolia  inj due 07/30/24

## 2024-02-04 NOTE — Telephone Encounter (Signed)
 [facility] [employer]  Patient is ready for scheduling on or after 01/29/24  Out-of-pocket cost due at time of visit: $346.87   Primary: [employer] Advantage Gold Plus Prolia  co-insurance: 20% (approximately $331.87) Admin fee co-insurance: $15  Deductible: does not apply  Prior Auth: APPROVED PA# [id number] Valid: 03/31/21-03/06/24  Secondary: N/A Prolia  co-insurance:  Admin fee co-insurance:  Deductible:  Prior Auth:  PA# Valid:   ** This summary of benefits is an estimation of the patient's out-of-pocket cost. Exact cost may vary based on individual plan coverage.

## 2024-02-05 ENCOUNTER — Ambulatory Visit: Admitting: Endocrinology

## 2024-03-27 ENCOUNTER — Ambulatory Visit: Admitting: Endocrinology

## 2024-03-27 ENCOUNTER — Other Ambulatory Visit

## 2024-03-27 ENCOUNTER — Encounter: Payer: Self-pay | Admitting: Endocrinology

## 2024-03-27 ENCOUNTER — Ambulatory Visit: Payer: Self-pay | Admitting: Endocrinology

## 2024-03-27 VITALS — BP 136/80 | HR 71 | Resp 16 | Ht 64.0 in | Wt 157.8 lb

## 2024-03-27 DIAGNOSIS — E559 Vitamin D deficiency, unspecified: Secondary | ICD-10-CM | POA: Diagnosis not present

## 2024-03-27 DIAGNOSIS — Z9889 Other specified postprocedural states: Secondary | ICD-10-CM | POA: Diagnosis not present

## 2024-03-27 DIAGNOSIS — E1165 Type 2 diabetes mellitus with hyperglycemia: Secondary | ICD-10-CM

## 2024-03-27 DIAGNOSIS — Z7984 Long term (current) use of oral hypoglycemic drugs: Secondary | ICD-10-CM | POA: Diagnosis not present

## 2024-03-27 DIAGNOSIS — M810 Age-related osteoporosis without current pathological fracture: Secondary | ICD-10-CM | POA: Diagnosis not present

## 2024-03-27 DIAGNOSIS — E210 Primary hyperparathyroidism: Secondary | ICD-10-CM | POA: Diagnosis not present

## 2024-03-27 LAB — POCT GLYCOSYLATED HEMOGLOBIN (HGB A1C): Hemoglobin A1C: 6.4 % — AB (ref 4.0–5.6)

## 2024-03-27 NOTE — Progress Notes (Signed)
 "  Outpatient Endocrinology Note Jillian Lara, MD  03/27/24  Patient's Name: Jillian Powell    DOB: [birth year]    MRN: [id number]                                                    REASON OF VISIT: Follow up for type 2 diabetes mellitus / osteoporosis  PCP: Sigrid Deidra Fox, MD  HISTORY OF PRESENT ILLNESS:   Jillian Powell is a 74 y.o. old female with past medical history listed below, is here for follow up for type 2 diabetes mellitus , primary hyperparathyroidism status post parathyroidectomy, osteoporosis.   Pertinent Diabetes History: Patient was previously seen by Dr. Von and was last time seen in July 2024.  Patient was diagnosed with type 2 diabetes mellitus in March 2019 with hemoglobin A1c of 7%.  She had seen dietitian at time time and did not like to be on metformin .  Diabetes was previously managed by primary care provider however in July 2024, she was evaluated by Dr. Von in this endocrinology clinic and was started on Jardiance .  No personal history of pancreatitis and / or family history of medullary thyroid carcinoma or MEN 2B syndrome.   Chronic Diabetes Complications : Retinopathy: no. Last ophthalmology exam was done on annually, following with ophthalmology regularly.  Nephropathy: no Peripheral neuropathy: no Coronary artery disease: no Stroke: no  Relevant comorbidities and cardiovascular risk factors: Obesity: no Body mass index is 27.09 kg/m.  Hypertension: no  Hyperlipidemia : no, not on statin   Current / Home Diabetic regimen includes:  Metformin  extended release-milligram daily.  Prior diabetic medications: Patient stopped taking Jardiance  in January 2025 due to nausea, stomach pain and constipation. Was on Lantus  10 units daily, has not been taking since July 2025.  Glycemic data:   Patient has Accu-Chek guide glucometer.  Forgot to bring glucometer in the clinic today.  By recall of fasting blood sugar are 99-113 range.  After  reading 137.  Hypoglycemia: Patient has no hypoglycemic episodes. Patient has hypoglycemia awareness.  Factors modifying glucose control: 1.  Diabetic diet assessment: 3 meals a day.  2.  Staying active or exercising:   3.  Medication compliance: compliant most of the time.  # Osteoporosis : -No history of fracture.  She has mild height loss.  -DEXA scan on September 18, 2023 consistent with osteoporosis, T-score at L1, L2 -3.0, left femoral neck T-score -2.6, left total hip T-score -2.4, right femoral neck T-score -2.3, right total hip T-score -2.3.  DEXA scan not compared with DEXA scan in 2022.  BONE density results: Consistent with osteoporosis.   DEXA scan in April 2022.     Lumbar spine L1-L4 Femoral neck (FN) 33% distal radius  T-scores 2019 -2.6 RFN: -2.3 LFN: -2.4 n/a  T-scores 06/2020 -2.9 -2.9 LFN Total femur = -2.8 n/a    Previous bone density in 2017 showed spine T score -2.5  Prior treatment has included boniva for 3 years, not clear of the starting and stopping dates, probably stopped in 2017. In 2019 she was given risedronate  150 mg monthly but she stopped this because of high cost. Subsequently was recommended alendronate but she did not take it. She did not want to take Reclast and was able to start Prolia  after prior authorization.  - She had her first  injection of Prolia  60 mg on 05/28/2021, continued every 6 months.  Last injection was in November 2025.   Also has been taking 2000 units of vitamin D3 since her baseline level was low, with normalization of serum vitamin D  level.  # Primary hyperparathyroidism/hypercalcemia -She had high calcium since November 2016, previous highest serum calcium was 11.4.  Highest PTH level was previously 46.  She had high calcium 526 in the urine in the past.  Because of persistent hypercalcemia, kidney stone and osteoporosis, she was referred for parathyroidectomy.  Parathyroids were not visualized on MRI and previous histories had  not come from the parathyroid  adenoma.  However neck exploration showed left inferior parathyroid  adenoma which was removed in June 2024 by Dr. Cyrilla.  Left inferior and right superior parathyroidectomy pathology was consistent with hypercellular parathyroid  tissue 286 mg ( left).  Patient has normalization of serum calcium after surgery.  Interval history  Hemoglobin A1c 6.4% today.  She has only been taking metformin  1 tablet daily and denies GI issues.  She has no longer been taking Lantus  for several months from the last visit in July.  Reports normal blood sugar at home.  No numbness and tingling of the feet.  No vision problem.  She has been taking vitamin D3 2000 international unit daily.  No fall and fracture.  She also eats cheese and yogurt multiple times a week.  No other complaints today.   REVIEW OF SYSTEMS As per history of present illness.   PAST MEDICAL HISTORY: Past Medical History:  Diagnosis Date   Arthritis    Diabetes mellitus without complication (HCC)    Elevated liver enzymes    GERD (gastroesophageal reflux disease)    History of kidney stones    Hypertension    Pre-diabetes     PAST SURGICAL HISTORY: Past Surgical History:  Procedure Laterality Date   EYE SURGERY Left    PARATHYROIDECTOMY Left 08/08/2022   Procedure: LEFT INFERIOR PARATHYROIDECTOMY, NECK EXPLORATION; BIOPSY RIGHT SUPERIOR PARATHYROID ;  Surgeon: Eletha Boas, MD;  Location: [facility];  Service: General;  Laterality: Left;   TUBAL LIGATION      ALLERGIES: Allergies  Allergen Reactions   Shellfish Allergy Anaphylaxis   Shrimp Extract Itching   Penicillins Hives, Itching and Rash   Sulfa Antibiotics Hives, Itching and Rash    FAMILY HISTORY:  Family History  Problem Relation Age of Onset   Diabetes Mother    CAD Mother    Other Father        old age died at 17   Thyroid disease Neg Hx    Breast cancer Neg Hx    Colon cancer Neg Hx    Liver cancer Neg Hx     SOCIAL  HISTORY: Social History   Socioeconomic History   Marital status: Married    Spouse name: Not on file   Number of children: 4   Years of education: Not on file   Highest education level: Not on file  Occupational History   Occupation: retired  Tobacco Use   Smoking status: Never   Smokeless tobacco: Never  Vaping Use   Vaping status: Never Used  Substance and Sexual Activity   Alcohol  use: No   Drug use: No   Sexual activity: Not Currently  Other Topics Concern   Not on file  Social History Narrative   Not on file   Social Drivers of Health   Tobacco Use: Low Risk (03/27/2024)   Patient History  Smoking Tobacco Use: Never    Smokeless Tobacco Use: Never    Passive Exposure: Not on file  Financial Resource Strain: Not on file  Food Insecurity: No Food Insecurity (08/08/2022)   Hunger Vital Sign    Worried About Running Out of Food in the Last Year: Never true    Ran Out of Food in the Last Year: Never true  Transportation Needs: No Transportation Needs (08/08/2022)   PRAPARE - Administrator, Civil Service (Medical): No    Lack of Transportation (Non-Medical): No  Physical Activity: Not on file  Stress: Not on file  Social Connections: Unknown (07/18/2021)   Received from [facility]   Social Network    Social Network: Not on file  Depression (PHQ2-9): Low Risk (12/25/2023)   Depression (PHQ2-9)    PHQ-2 Score: 0  Alcohol  Screen: Not on file  Housing: Low Risk (08/08/2022)   Housing    Last Housing Risk Score: 0  Utilities: Not At Risk (08/08/2022)   AHC Utilities    Threatened with loss of utilities: No  Health Literacy: Not on file    MEDICATIONS:  Current Outpatient Medications  Medication Sig Dispense Refill   Accu-Chek Softclix Lancets lancets USE AS DIRECTED TO TEST BLOOD GLUCOSE IN THE MORNING, AT NOON, AND AT BEDTIME 100 each 0   Blood Glucose Monitoring Suppl (TGT BLOOD GLUCOSE MONITORING) w/Device KIT Use 1 unit as directed once a day  to  check blood sugars.     Blood Glucose Monitoring Suppl DEVI 1 each by Does not apply route in the morning, at noon, and at bedtime. May substitute to any manufacturer covered by patient's insurance. 1 each 0   Cholecalciferol 25 MCG (1000 UT) tablet Take 1,000 Units by mouth daily.     ciclopirox  (PENLAC ) 8 % solution Apply topically at bedtime. Apply over nail and surrounding skin. Apply daily over previous coat. Remove weekly with polish remover. 6.6 mL 11   GINKGO BILOBA PO Take 1 tablet by mouth daily.     Glucose Blood (BLOOD GLUCOSE TEST STRIPS) STRP Check daily in the morning. May substitute to any manufacturer covered by patient's insurance. 100 each 3   ibuprofen (ADVIL) 200 MG tablet Take 400-600 mg by mouth 2 (two) times daily as needed for headache or moderate pain.     insulin  glargine (LANTUS  SOLOSTAR) 100 UNIT/ML Solostar Pen Inject 5 Units into the skin daily. 15 mL 4   Insulin  Pen Needle 32G X 4 MM MISC Use 1x a day 100 each 3   Lidocaine  HCl (ASPERCREME LIDOCAINE ) 4 % CREA Apply 1 Application topically daily as needed (pain).     LOTREL 5-10 MG capsule Take 1 capsule by mouth daily.   1   metFORMIN  (GLUCOPHAGE -XR) 500 MG 24 hr tablet Take 1 tablet (500 mg total) by mouth daily with breakfast. 90 tablet 3   Multiple Vitamins-Minerals (ZINC PO) Take 1 capsule by mouth daily as needed (cold symptoms).     Polyethyl Glycol-Propyl Glycol (SYSTANE) 0.4-0.3 % SOLN Place 1 drop into both eyes at bedtime.     TRUE METRIX BLOOD GLUCOSE TEST test strip      Current Facility-Administered Medications  Medication Dose Route Frequency Provider Last Rate Last Admin   [START ON 07/29/2024] denosumab  (PROLIA ) injection 60 mg  60 mg Subcutaneous Once Breland Trouten, MD        PHYSICAL EXAM: Vitals:   03/27/24 1128  BP: 136/80  Pulse: 71  Resp: 16  SpO2: 97%  Weight: 157 lb 12.8 oz (71.6 kg)  Height: 5' 4 (1.626 m)   Body mass index is 27.09 kg/m.  Wt Readings from Last 3 Encounters:   03/27/24 157 lb 12.8 oz (71.6 kg)  12/25/23 158 lb (71.7 kg)  10/04/23 158 lb 6.4 oz (71.8 kg)    General: Well developed, well nourished female in no apparent distress.  HEENT: AT/[place], no external lesions.  Eyes: Conjunctiva clear and no icterus. Neck: Neck supple  Lungs: Respirations not labored Neurologic: Alert, oriented, normal speech Extremities / Skin: Dry.  Psychiatric: Does not appear depressed or anxious  Diabetic Foot Exam - Simple   Simple Foot Form Diabetic Foot exam was performed with the following findings: Yes 03/27/2024 11:45 AM  Visual Inspection No deformities, no ulcerations, no other skin breakdown bilaterally: Yes Sensation Testing Intact to touch and monofilament testing bilaterally: Yes Pulse Check Posterior Tibialis and Dorsalis pulse intact bilaterally: Yes Comments     LABS Reviewed Lab Results  Component Value Date   HGBA1C 6.4 (A) 03/27/2024   HGBA1C 6.9 (A) 05/29/2023   HGBA1C 7.6 (H) 12/01/2022   No results found for: FRUCTOSAMINE No results found for: CHOL, HDL, LDLCALC, LDLDIRECT, TRIG, CHOLHDL No results found for: MICRALBCREAT  Lab Results  Component Value Date   CREATININE 0.83 12/01/2022   Lab Results  Component Value Date   GFR 70.40 12/01/2022   Parathyroid  scan done in 03/2016: Mild activity corresponding to 5 x 5 x 3 mm nodular density posterior to the upper pole of the right thyroid lobe. These findings suggest the possibility of small parathyroid  adenoma at this site.    THYROID ultrasound from 12/22 shows a 6 mm nodule adjacent to the left thyroid lobe suspicious for parathyroid  adenoma but no nodule on the right side  ASSESSMENT / PLAN  1. Type 2 diabetes mellitus with hyperglycemia, without long-term current use of insulin  (HCC)   2. Age-related osteoporosis without current pathological fracture   3. History of parathyroid  surgery   4. Primary hyperparathyroidism   5. Vitamin D  deficiency      Diabetes Mellitus type 2, complicated by no other known complications. - Diabetic status / severity: controlled.  Lab Results  Component Value Date   HGBA1C 6.4 (A) 03/27/2024    - Hemoglobin A1c goal : <6.5%  Controlled type 2 diabetes mellitus.  -MEDICATIONS: See below.  I) continue metformin  extended release 500 mg daily.  - Home glucose testing: In the morning fasting daily and occasionally at bedtime.  Asked to bring glucometer in the follow-up visit.  - Discussed/ Gave Hypoglycemia treatment plan.  # Consult : not required at this time.   # Annual urine for microalbuminuria/ creatinine ratio, no microalbuminuria currently.  Will check today along with BMP.   Last  No results found for: MICRALBCREAT   # Foot check nightly.  # Annual dilated diabetic eye exams.   - Diet: Make healthy diabetic food choices - Life style / activity / exercise: Discussed.  2. Blood pressure  -  BP Readings from Last 1 Encounters:  03/27/24 136/80    - Control is in target.  - No change in current plans.  3. Lipid status / Hyperlipidemia - Last No results found for: [facility] -Currently not on statin.  Being monitored by PCP.  Managed by primary care provider.  # Osteoporosis -Continue Prolia  60 mg subcutaneous every 6 months.  Last injection in November 2025.  Next in May 2026. -Continue current dose of  vitamin D3 2000 international unit daily. -Advised to take additional calcium 500 mg over-the-counter 1 tablet daily.  In addition to her dietary calcium intake in the form of cheese and milk. -Discussed about fall precautions. - Last DEXA scan in July 2025, reviewed possibly mild improvement in bone mineral density. - Check vitamin D  level.  # Primary hyperparathyroidism status post parathyroidectomy in June 2024. -Patient has history of kidney stone.  She has normalization of serum calcium after parathyroid  surgery.  Will monitor serum calcium annually. - Check BMP,  serum calcium and PTH today.  Diagnoses and all orders for this visit:  Type 2 diabetes mellitus with hyperglycemia, without long-term current use of insulin  (HCC) -     POCT glycosylated hemoglobin (Hb A1C) -     Basic metabolic panel with GFR -     Microalbumin / creatinine urine ratio  Age-related osteoporosis without current pathological fracture -     VITAMIN D  25 Hydroxy (Vit-D Deficiency, Fractures)  History of parathyroid  surgery -     Parathyroid  hormone, intact (no Ca)  Primary hyperparathyroidism -     Parathyroid  hormone, intact (no Ca)  Vitamin D  deficiency -     VITAMIN D  25 Hydroxy (Vit-D Deficiency, Fractures)     DISPOSITION Follow up in clinic in 5  months suggested.  Labs today as ordered.   All questions answered and patient verbalized understanding of the plan.  Denae Zulueta, MD [facility] Endocrinology [facility] [address] [place], [address] [place], [place] [address] Phone # [phone]  At least part of this note was generated using voice recognition software. Inadvertent word errors may have occurred, which were not recognized during the proofreading process. "

## 2024-03-27 NOTE — Patient Instructions (Signed)
 Metformin  XR 500 mg DAILY.   Take calcium ~ 500 mg daily, over the counter.

## 2024-03-28 LAB — BASIC METABOLIC PANEL WITH GFR
BUN: 15 mg/dL (ref 7–25)
CO2: 24 mmol/L (ref 20–32)
Calcium: 9.3 mg/dL (ref 8.6–10.4)
Chloride: 106 mmol/L (ref 98–110)
Creat: 0.65 mg/dL (ref 0.60–1.00)
Glucose, Bld: 107 mg/dL — ABNORMAL HIGH (ref 65–99)
Potassium: 4 mmol/L (ref 3.5–5.3)
Sodium: 139 mmol/L (ref 135–146)
eGFR: 93 mL/min/1.73m2

## 2024-03-28 LAB — PARATHYROID HORMONE, INTACT (NO CA): PTH: 53 pg/mL (ref 16–77)

## 2024-03-28 LAB — VITAMIN D 25 HYDROXY (VIT D DEFICIENCY, FRACTURES): Vit D, 25-Hydroxy: 72 ng/mL (ref 30–100)

## 2024-08-02 ENCOUNTER — Ambulatory Visit

## 2024-08-26 ENCOUNTER — Ambulatory Visit: Admitting: Endocrinology
